# Patient Record
Sex: Female | Born: 1945 | Race: White | Hispanic: No | Smoking: Former smoker
Health system: Southern US, Community
[De-identification: ages and names within clinical notes are randomized; demographics above are authoritative.]

## PROBLEM LIST (undated history)

## (undated) DIAGNOSIS — Z8619 Personal history of other infectious and parasitic diseases: Secondary | ICD-10-CM

## (undated) DIAGNOSIS — Z972 Presence of dental prosthetic device (complete) (partial): Secondary | ICD-10-CM

## (undated) DIAGNOSIS — I10 Essential (primary) hypertension: Secondary | ICD-10-CM

## (undated) DIAGNOSIS — M1990 Unspecified osteoarthritis, unspecified site: Secondary | ICD-10-CM

## (undated) DIAGNOSIS — R112 Nausea with vomiting, unspecified: Secondary | ICD-10-CM

## (undated) DIAGNOSIS — R490 Dysphonia: Secondary | ICD-10-CM

## (undated) DIAGNOSIS — K219 Gastro-esophageal reflux disease without esophagitis: Secondary | ICD-10-CM

## (undated) DIAGNOSIS — Z9889 Other specified postprocedural states: Secondary | ICD-10-CM

## (undated) DIAGNOSIS — M810 Age-related osteoporosis without current pathological fracture: Secondary | ICD-10-CM

## (undated) DIAGNOSIS — Z860100 Personal history of colon polyps, unspecified: Secondary | ICD-10-CM

## (undated) DIAGNOSIS — I209 Angina pectoris, unspecified: Secondary | ICD-10-CM

## (undated) DIAGNOSIS — E039 Hypothyroidism, unspecified: Secondary | ICD-10-CM

## (undated) HISTORY — PX: BREAST SURGERY: SHX581

## (undated) HISTORY — PX: COLONOSCOPY: SHX174

## (undated) HISTORY — PX: EYE SURGERY: SHX253

## (undated) HISTORY — PX: JOINT REPLACEMENT: SHX530

## (undated) HISTORY — PX: ABDOMINAL HYSTERECTOMY: SHX81

## (undated) HISTORY — PX: ESOPHAGOGASTRODUODENOSCOPY: SHX1529

---

## 2003-05-06 HISTORY — PX: BREAST EXCISIONAL BIOPSY: SUR124

## 2004-11-14 ENCOUNTER — Ambulatory Visit: Payer: Self-pay | Admitting: Unknown Physician Specialty

## 2005-12-10 ENCOUNTER — Ambulatory Visit: Payer: Self-pay | Admitting: Unknown Physician Specialty

## 2006-07-03 ENCOUNTER — Ambulatory Visit: Payer: Self-pay | Admitting: Unknown Physician Specialty

## 2006-09-09 ENCOUNTER — Ambulatory Visit: Payer: Self-pay | Admitting: Unknown Physician Specialty

## 2007-03-02 ENCOUNTER — Ambulatory Visit: Payer: Self-pay | Admitting: Unknown Physician Specialty

## 2008-03-02 ENCOUNTER — Ambulatory Visit: Payer: Self-pay | Admitting: Family Medicine

## 2008-04-07 ENCOUNTER — Ambulatory Visit: Payer: Self-pay | Admitting: Unknown Physician Specialty

## 2010-05-02 ENCOUNTER — Ambulatory Visit: Payer: Self-pay | Admitting: Internal Medicine

## 2011-06-30 ENCOUNTER — Ambulatory Visit: Payer: Self-pay | Admitting: Internal Medicine

## 2012-06-30 ENCOUNTER — Ambulatory Visit: Payer: Self-pay | Admitting: Internal Medicine

## 2013-04-05 ENCOUNTER — Ambulatory Visit: Payer: Self-pay | Admitting: Unknown Physician Specialty

## 2013-07-06 ENCOUNTER — Ambulatory Visit: Payer: Self-pay | Admitting: Internal Medicine

## 2014-07-10 ENCOUNTER — Ambulatory Visit: Payer: Self-pay | Admitting: Internal Medicine

## 2015-06-05 DIAGNOSIS — K219 Gastro-esophageal reflux disease without esophagitis: Secondary | ICD-10-CM | POA: Diagnosis not present

## 2015-06-05 DIAGNOSIS — M1990 Unspecified osteoarthritis, unspecified site: Secondary | ICD-10-CM | POA: Diagnosis not present

## 2015-06-05 DIAGNOSIS — I10 Essential (primary) hypertension: Secondary | ICD-10-CM | POA: Diagnosis not present

## 2015-06-05 DIAGNOSIS — E039 Hypothyroidism, unspecified: Secondary | ICD-10-CM | POA: Diagnosis not present

## 2015-06-05 DIAGNOSIS — M25559 Pain in unspecified hip: Secondary | ICD-10-CM | POA: Diagnosis not present

## 2015-06-12 ENCOUNTER — Other Ambulatory Visit: Payer: Self-pay | Admitting: Internal Medicine

## 2015-06-12 DIAGNOSIS — K219 Gastro-esophageal reflux disease without esophagitis: Secondary | ICD-10-CM | POA: Diagnosis not present

## 2015-06-12 DIAGNOSIS — Z1231 Encounter for screening mammogram for malignant neoplasm of breast: Secondary | ICD-10-CM

## 2015-06-12 DIAGNOSIS — L298 Other pruritus: Secondary | ICD-10-CM | POA: Diagnosis not present

## 2015-06-12 DIAGNOSIS — Z1239 Encounter for other screening for malignant neoplasm of breast: Secondary | ICD-10-CM | POA: Diagnosis not present

## 2015-06-12 DIAGNOSIS — Z0000 Encounter for general adult medical examination without abnormal findings: Secondary | ICD-10-CM | POA: Diagnosis not present

## 2015-06-12 DIAGNOSIS — I10 Essential (primary) hypertension: Secondary | ICD-10-CM | POA: Diagnosis not present

## 2015-06-12 DIAGNOSIS — E039 Hypothyroidism, unspecified: Secondary | ICD-10-CM | POA: Diagnosis not present

## 2015-07-11 ENCOUNTER — Ambulatory Visit
Admission: RE | Admit: 2015-07-11 | Discharge: 2015-07-11 | Disposition: A | Discharge status: Home / Self Care | Payer: PPO | Source: Ambulatory Visit | Attending: Internal Medicine | Admitting: Internal Medicine

## 2015-07-11 DIAGNOSIS — Z1231 Encounter for screening mammogram for malignant neoplasm of breast: Secondary | ICD-10-CM | POA: Diagnosis not present

## 2015-07-11 DIAGNOSIS — R921 Mammographic calcification found on diagnostic imaging of breast: Secondary | ICD-10-CM | POA: Diagnosis not present

## 2015-07-16 ENCOUNTER — Other Ambulatory Visit: Payer: Self-pay | Admitting: Internal Medicine

## 2015-07-16 DIAGNOSIS — R920 Mammographic microcalcification found on diagnostic imaging of breast: Secondary | ICD-10-CM

## 2015-07-18 ENCOUNTER — Ambulatory Visit
Admission: RE | Admit: 2015-07-18 | Discharge: 2015-07-18 | Disposition: A | Discharge status: Home / Self Care | Payer: PPO | Source: Ambulatory Visit | Attending: Internal Medicine | Admitting: Internal Medicine

## 2015-07-18 DIAGNOSIS — R921 Mammographic calcification found on diagnostic imaging of breast: Secondary | ICD-10-CM | POA: Diagnosis not present

## 2015-07-18 DIAGNOSIS — R920 Mammographic microcalcification found on diagnostic imaging of breast: Secondary | ICD-10-CM

## 2015-07-18 DIAGNOSIS — R928 Other abnormal and inconclusive findings on diagnostic imaging of breast: Secondary | ICD-10-CM | POA: Diagnosis not present

## 2015-07-31 ENCOUNTER — Ambulatory Visit: Payer: PPO

## 2015-07-31 ENCOUNTER — Other Ambulatory Visit: Payer: PPO

## 2015-10-10 DIAGNOSIS — B001 Herpesviral vesicular dermatitis: Secondary | ICD-10-CM | POA: Diagnosis not present

## 2015-10-10 DIAGNOSIS — B351 Tinea unguium: Secondary | ICD-10-CM | POA: Diagnosis not present

## 2015-12-04 DIAGNOSIS — I10 Essential (primary) hypertension: Secondary | ICD-10-CM | POA: Diagnosis not present

## 2015-12-04 DIAGNOSIS — E039 Hypothyroidism, unspecified: Secondary | ICD-10-CM | POA: Diagnosis not present

## 2015-12-04 DIAGNOSIS — K219 Gastro-esophageal reflux disease without esophagitis: Secondary | ICD-10-CM | POA: Diagnosis not present

## 2015-12-04 DIAGNOSIS — L298 Other pruritus: Secondary | ICD-10-CM | POA: Diagnosis not present

## 2015-12-11 DIAGNOSIS — R002 Palpitations: Secondary | ICD-10-CM | POA: Diagnosis not present

## 2015-12-18 DIAGNOSIS — R002 Palpitations: Secondary | ICD-10-CM | POA: Diagnosis not present

## 2016-01-01 DIAGNOSIS — R790 Abnormal level of blood mineral: Secondary | ICD-10-CM | POA: Diagnosis not present

## 2016-04-18 DIAGNOSIS — Z86010 Personal history of colonic polyps: Secondary | ICD-10-CM | POA: Diagnosis not present

## 2016-04-18 DIAGNOSIS — R490 Dysphonia: Secondary | ICD-10-CM | POA: Diagnosis not present

## 2016-04-18 DIAGNOSIS — K219 Gastro-esophageal reflux disease without esophagitis: Secondary | ICD-10-CM | POA: Diagnosis not present

## 2016-06-06 DIAGNOSIS — J40 Bronchitis, not specified as acute or chronic: Secondary | ICD-10-CM | POA: Diagnosis not present

## 2016-06-10 DIAGNOSIS — I10 Essential (primary) hypertension: Secondary | ICD-10-CM | POA: Diagnosis not present

## 2016-06-10 DIAGNOSIS — K219 Gastro-esophageal reflux disease without esophagitis: Secondary | ICD-10-CM | POA: Diagnosis not present

## 2016-06-10 DIAGNOSIS — R002 Palpitations: Secondary | ICD-10-CM | POA: Diagnosis not present

## 2016-06-10 DIAGNOSIS — E039 Hypothyroidism, unspecified: Secondary | ICD-10-CM | POA: Diagnosis not present

## 2016-06-11 ENCOUNTER — Ambulatory Visit: Admit: 2016-06-11 | Payer: PPO | Admitting: Unknown Physician Specialty

## 2016-06-11 SURGERY — EGD (ESOPHAGOGASTRODUODENOSCOPY)
Anesthesia: General

## 2016-06-17 DIAGNOSIS — K219 Gastro-esophageal reflux disease without esophagitis: Secondary | ICD-10-CM | POA: Diagnosis not present

## 2016-06-17 DIAGNOSIS — Z1231 Encounter for screening mammogram for malignant neoplasm of breast: Secondary | ICD-10-CM | POA: Diagnosis not present

## 2016-06-17 DIAGNOSIS — E039 Hypothyroidism, unspecified: Secondary | ICD-10-CM | POA: Diagnosis not present

## 2016-06-17 DIAGNOSIS — Z0000 Encounter for general adult medical examination without abnormal findings: Secondary | ICD-10-CM | POA: Diagnosis not present

## 2016-06-17 DIAGNOSIS — R739 Hyperglycemia, unspecified: Secondary | ICD-10-CM | POA: Diagnosis not present

## 2016-06-17 DIAGNOSIS — I10 Essential (primary) hypertension: Secondary | ICD-10-CM | POA: Diagnosis not present

## 2016-06-19 ENCOUNTER — Other Ambulatory Visit: Payer: Self-pay | Admitting: Internal Medicine

## 2016-06-19 DIAGNOSIS — Z1231 Encounter for screening mammogram for malignant neoplasm of breast: Secondary | ICD-10-CM

## 2016-07-07 ENCOUNTER — Ambulatory Visit: Admit: 2016-07-07 | Payer: PPO | Admitting: Unknown Physician Specialty

## 2016-07-07 SURGERY — COLONOSCOPY WITH PROPOFOL
Anesthesia: General

## 2016-07-08 ENCOUNTER — Encounter: Payer: Self-pay | Admitting: *Deleted

## 2016-07-09 ENCOUNTER — Encounter: Payer: Self-pay | Admitting: *Deleted

## 2016-07-09 ENCOUNTER — Ambulatory Visit
Admission: RE | Admit: 2016-07-09 | Discharge: 2016-07-09 | Disposition: A | Discharge status: Home / Self Care | Payer: PPO | Source: Ambulatory Visit | Attending: Unknown Physician Specialty | Admitting: Unknown Physician Specialty

## 2016-07-09 ENCOUNTER — Encounter
Admission: RE | Disposition: A | Discharge status: Home / Self Care | Payer: Self-pay | Source: Ambulatory Visit | Attending: Unknown Physician Specialty

## 2016-07-09 ENCOUNTER — Ambulatory Visit: Payer: PPO | Admitting: Anesthesiology

## 2016-07-09 DIAGNOSIS — Z8719 Personal history of other diseases of the digestive system: Secondary | ICD-10-CM | POA: Diagnosis not present

## 2016-07-09 DIAGNOSIS — K219 Gastro-esophageal reflux disease without esophagitis: Secondary | ICD-10-CM | POA: Diagnosis not present

## 2016-07-09 DIAGNOSIS — D120 Benign neoplasm of cecum: Secondary | ICD-10-CM | POA: Diagnosis not present

## 2016-07-09 DIAGNOSIS — Z87891 Personal history of nicotine dependence: Secondary | ICD-10-CM | POA: Diagnosis not present

## 2016-07-09 DIAGNOSIS — K5730 Diverticulosis of large intestine without perforation or abscess without bleeding: Secondary | ICD-10-CM | POA: Diagnosis not present

## 2016-07-09 DIAGNOSIS — Z79899 Other long term (current) drug therapy: Secondary | ICD-10-CM | POA: Diagnosis not present

## 2016-07-09 DIAGNOSIS — K5790 Diverticulosis of intestine, part unspecified, without perforation or abscess without bleeding: Secondary | ICD-10-CM | POA: Diagnosis not present

## 2016-07-09 DIAGNOSIS — K648 Other hemorrhoids: Secondary | ICD-10-CM | POA: Diagnosis not present

## 2016-07-09 DIAGNOSIS — Z1211 Encounter for screening for malignant neoplasm of colon: Secondary | ICD-10-CM | POA: Diagnosis not present

## 2016-07-09 DIAGNOSIS — I10 Essential (primary) hypertension: Secondary | ICD-10-CM | POA: Diagnosis not present

## 2016-07-09 DIAGNOSIS — K640 First degree hemorrhoids: Secondary | ICD-10-CM | POA: Insufficient documentation

## 2016-07-09 DIAGNOSIS — E039 Hypothyroidism, unspecified: Secondary | ICD-10-CM | POA: Insufficient documentation

## 2016-07-09 DIAGNOSIS — K2950 Unspecified chronic gastritis without bleeding: Secondary | ICD-10-CM | POA: Insufficient documentation

## 2016-07-09 DIAGNOSIS — Z86010 Personal history of colonic polyps: Secondary | ICD-10-CM | POA: Diagnosis not present

## 2016-07-09 DIAGNOSIS — K635 Polyp of colon: Secondary | ICD-10-CM | POA: Diagnosis not present

## 2016-07-09 DIAGNOSIS — K317 Polyp of stomach and duodenum: Secondary | ICD-10-CM | POA: Diagnosis not present

## 2016-07-09 DIAGNOSIS — K2960 Other gastritis without bleeding: Secondary | ICD-10-CM | POA: Diagnosis not present

## 2016-07-09 DIAGNOSIS — K3189 Other diseases of stomach and duodenum: Secondary | ICD-10-CM | POA: Diagnosis not present

## 2016-07-09 HISTORY — PX: ESOPHAGOGASTRODUODENOSCOPY: SHX5428

## 2016-07-09 HISTORY — PX: COLONOSCOPY WITH PROPOFOL: SHX5780

## 2016-07-09 HISTORY — DX: Hypothyroidism, unspecified: E03.9

## 2016-07-09 HISTORY — DX: Personal history of colon polyps, unspecified: Z86.0100

## 2016-07-09 HISTORY — DX: Gastro-esophageal reflux disease without esophagitis: K21.9

## 2016-07-09 HISTORY — DX: Age-related osteoporosis without current pathological fracture: M81.0

## 2016-07-09 HISTORY — DX: Essential (primary) hypertension: I10

## 2016-07-09 HISTORY — DX: Personal history of other infectious and parasitic diseases: Z86.19

## 2016-07-09 HISTORY — DX: Personal history of colonic polyps: Z86.010

## 2016-07-09 HISTORY — DX: Dysphonia: R49.0

## 2016-07-09 SURGERY — EGD (ESOPHAGOGASTRODUODENOSCOPY)
Anesthesia: General

## 2016-07-09 MED ORDER — KETAMINE HCL 10 MG/ML IJ SOLN
INTRAMUSCULAR | Status: AC
  Filled 2016-07-09: qty 1

## 2016-07-09 MED ORDER — SODIUM CHLORIDE 0.9 % IV SOLN
INTRAVENOUS | Status: DC
  Administered 2016-07-09: 09:00:00 via INTRAVENOUS
  Administered 2016-07-09: 1000 mL via INTRAVENOUS

## 2016-07-09 MED ORDER — SODIUM CHLORIDE 0.9 % IV SOLN: INTRAVENOUS | Status: DC

## 2016-07-09 MED ORDER — KETAMINE HCL 50 MG/ML IJ SOLN
INTRAMUSCULAR | Status: DC | PRN
  Administered 2016-07-09: 5 mg via INTRAMUSCULAR

## 2016-07-09 MED ORDER — PROPOFOL 10 MG/ML IV BOLUS
INTRAVENOUS | Status: AC
  Filled 2016-07-09: qty 20

## 2016-07-09 MED ORDER — EPHEDRINE SULFATE 50 MG/ML IJ SOLN
INTRAMUSCULAR | Status: DC | PRN
  Administered 2016-07-09: 10 mg via INTRAVENOUS

## 2016-07-09 MED ORDER — PROPOFOL 500 MG/50ML IV EMUL
INTRAVENOUS | Status: DC | PRN
  Administered 2016-07-09: 50 ug/kg/min via INTRAVENOUS

## 2016-07-09 MED ORDER — PHENYLEPHRINE HCL 10 MG/ML IJ SOLN
INTRAMUSCULAR | Status: DC | PRN
  Administered 2016-07-09 (×2): 100 ug via INTRAVENOUS

## 2016-07-09 NOTE — Op Note (Signed)
[facility] Gastroenterology Patient Name: Brandi Schwartz Procedure Date: 07/09/2016 9:17 AM MRN: [id number] Account #: [id number] Date of Birth: 1946-04-21 Admit Type: Outpatient Age: 71 Room: [facility] ENDO ROOM 4 Gender: Female Note Status: Finalized Procedure:            Upper GI endoscopy Indications:          Heartburn Providers:            Manya Silvas, MD Referring MD:         Tracie Harrier, MD (Referring MD) Medicines:            Propofol per Anesthesia Complications:        No immediate complications. Procedure:            Pre-Anesthesia Assessment:                       - After reviewing the risks and benefits, the patient                        was deemed in satisfactory condition to undergo the                        procedure.                       After obtaining informed consent, the endoscope was                        passed under direct vision. Throughout the procedure,                        the patient's blood pressure, pulse, and oxygen                        saturations were monitored continuously. The Endoscope                        was introduced through the mouth, and advanced to the                        second part of duodenum. The upper GI endoscopy was                        accomplished without difficulty. The patient tolerated                        the procedure well. Findings:      The examined esophagus was normal.      Diffuse and patchy mild inflammation characterized by erythema and       granularity was found in the gastric antrum. Biopsies were taken with a       cold forceps for histology. Biopsies were taken with a cold forceps for       Helicobacter pylori testing.      Multiple medium pedunculated and sessile fundic gland polyps with no       bleeding and no stigmata of recent bleeding were found in the gastric       body.      The examined duodenum was normal. Impression:           - Normal esophagus.             -  Gastritis. Biopsied.                       - Multiple fundic gland polyps.                       - Normal examined duodenum. Recommendation:       - Await pathology results.                       Take medicine.                       - Perform a colonoscopy as previously scheduled. Manya Silvas, MD 07/09/2016 9:37:23 AM This report has been signed electronically. Number of Addenda: 0 Note Initiated On: 07/09/2016 9:17 AM      [facility]

## 2016-07-09 NOTE — Anesthesia Preprocedure Evaluation (Signed)
Anesthesia Evaluation  Patient identified by MRN, date of birth, ID band Patient awake    Reviewed: Allergy & Precautions, NPO status , Patient's Chart, lab work & pertinent test results  History of Anesthesia Complications Negative for: history of anesthetic complications  Airway Mallampati: II  TM Distance: >3 FB Neck ROM: Full    Dental  (+) Upper Dentures   Pulmonary neg sleep apnea, neg COPD, former smoker,    breath sounds clear to auscultation- rhonchi (-) wheezing      Cardiovascular Exercise Tolerance: Good hypertension, Pt. on medications (-) CAD and (-) Past MI  Rhythm:Regular Rate:Normal - Systolic murmurs and - Diastolic murmurs    Neuro/Psych negative neurological ROS  negative psych ROS   GI/Hepatic Neg liver ROS, GERD  ,  Endo/Other  neg diabetesHypothyroidism   Renal/GU negative Renal ROS     Musculoskeletal negative musculoskeletal ROS (+)   Abdominal (+) + obese,   Peds  Hematology negative hematology ROS (+)   Anesthesia Other Findings Past Medical History: No date: GERD (gastroesophageal reflux disease) No date: History of colonic polyps No date: History of shingles No date: Hoarseness, chronic No date: Hypertension No date: Hypothyroidism No date: Osteoporosis   Reproductive/Obstetrics                             Anesthesia Physical Anesthesia Plan  ASA: II  Anesthesia Plan: General   Post-op Pain Management:    Induction: Intravenous  Airway Management Planned: Natural Airway  Additional Equipment:   Intra-op Plan:   Post-operative Plan:   Informed Consent: I have reviewed the patients History and Physical, chart, labs and discussed the procedure including the risks, benefits and alternatives for the proposed anesthesia with the patient or authorized representative who has indicated his/her understanding and acceptance.   Dental advisory  given  Plan Discussed with: CRNA and Anesthesiologist  Anesthesia Plan Comments:         Anesthesia Quick Evaluation

## 2016-07-09 NOTE — Transfer of Care (Signed)
Immediate Anesthesia Transfer of Care Note  Patient: Brandi Schwartz  Procedure(s) Performed: Procedure(s): ESOPHAGOGASTRODUODENOSCOPY (EGD) (N/A) COLONOSCOPY WITH PROPOFOL (N/A)  Patient Location: PACU  Anesthesia Type:General  Level of Consciousness: awake, alert  and oriented  Airway & Oxygen Therapy: Patient Spontanous Breathing and Patient connected to nasal cannula oxygen  Post-op Assessment: Report given to RN and Post -op Vital signs reviewed and stable  Post vital signs: Reviewed and stable  Last Vitals:  Vitals:   07/09/16 0847 07/09/16 1006  BP: 134/71 (!) 98/37  Pulse: 98 91  Resp: 20 (!) 27  Temp: 36.7 C (!) 35.7 C    Last Pain:  Vitals:   07/09/16 1006  TempSrc: Tympanic         Complications: No apparent anesthesia complications

## 2016-07-09 NOTE — Op Note (Signed)
[facility] Gastroenterology Patient Name: Brandi Schwartz Procedure Date: 07/09/2016 9:17 AM MRN: [id number] Account #: [id number] Date of Birth: 05-12-1945 Admit Type: Outpatient Age: 71 Room: [facility] ENDO ROOM 4 Gender: Female Note Status: Finalized Procedure:            Colonoscopy Indications:          High risk colon cancer surveillance: Personal history                        of colonic polyps Providers:            Manya Silvas, MD Medicines:            Propofol per Anesthesia Complications:        No immediate complications. Procedure:            Pre-Anesthesia Assessment:                       - After reviewing the risks and benefits, the patient                        was deemed in satisfactory condition to undergo the                        procedure.                       After obtaining informed consent, the colonoscope was                        passed under direct vision. Throughout the procedure,                        the patient's blood pressure, pulse, and oxygen                        saturations were monitored continuously. The                        Colonoscope was introduced through the anus and                        advanced to the the cecum, identified by appendiceal                        orifice and ileocecal valve. The colonoscopy was                        somewhat difficult due to restricted mobility of the                        colon. Successful completion of the procedure was aided                        by applying abdominal pressure. The patient tolerated                        the procedure well. The quality of the bowel                        preparation was excellent. Findings:      A  6 mm polyp was found in the appendiceal orifice. The polyp was       sessile. The polyp was removed with a hot snare. Resection and retrieval       were complete. This did not appear to extend into the appendix itself.      A few small-mouthed  diverticula were found in the sigmoid colon.      Internal hemorrhoids were found during endoscopy. The hemorrhoids were       small and Grade I (internal hemorrhoids that do not prolapse).      The exam was otherwise without abnormality. Impression:           - One 6 mm polyp at the appendiceal orifice, removed                        with a hot snare. Resected and retrieved.                       - Diverticulosis in the sigmoid colon.                       - Internal hemorrhoids.                       - The examination was otherwise normal. Recommendation:       - Await pathology results. Manya Silvas, MD 07/09/2016 10:06:11 AM This report has been signed electronically. Number of Addenda: 0 Note Initiated On: 07/09/2016 9:17 AM Scope Withdrawal Time: 0 hours 0 minutes 36 seconds  Total Procedure Duration: 0 hours 12 minutes 31 seconds       [facility]

## 2016-07-09 NOTE — Anesthesia Post-op Follow-up Note (Cosign Needed)
Anesthesia QCDR form completed.        

## 2016-07-09 NOTE — Anesthesia Postprocedure Evaluation (Signed)
Anesthesia Post Note  Patient: Brandi Schwartz  Procedure(s) Performed: Procedure(s) (LRB): ESOPHAGOGASTRODUODENOSCOPY (EGD) (N/A) COLONOSCOPY WITH PROPOFOL (N/A)  Patient location during evaluation: Endoscopy Anesthesia Type: General Level of consciousness: awake and alert and oriented Pain management: pain level controlled Vital Signs Assessment: post-procedure vital signs reviewed and stable Respiratory status: spontaneous breathing, nonlabored ventilation and respiratory function stable Cardiovascular status: blood pressure returned to baseline and stable Postop Assessment: no signs of nausea or vomiting Anesthetic complications: no     Last Vitals:  Vitals:   07/09/16 1026 07/09/16 1036  BP: 121/63 (!) 117/58  Pulse: 85 85  Resp: (!) 27 20  Temp:      Last Pain:  Vitals:   07/09/16 1006  TempSrc: Tympanic                 Alania Overholt

## 2016-07-09 NOTE — H&P (Signed)
   Primary Care Physician:  Tracie Harrier, MD Primary Gastroenterologist:  Dr. Vira Agar  Pre-Procedure History & Physical: HPI:  Brandi Schwartz is a 71 y.o. female is here for an endoscopy and colonoscopy.   Past Medical History:  Diagnosis Date  . GERD (gastroesophageal reflux disease)   . History of colonic polyps   . History of shingles   . Hoarseness, chronic   . Hypertension   . Hypothyroidism   . Osteoporosis     Past Surgical History:  Procedure Laterality Date  . ABDOMINAL HYSTERECTOMY    . BREAST BIOPSY Left 2005   EXCISIONAL - NEG  . COLONOSCOPY    . ESOPHAGOGASTRODUODENOSCOPY      Prior to Admission medications   Medication Sig Start Date End Date Taking? Authorizing Provider  ALPRAZolam Duanne Moron) 0.25 MG tablet Take 0.25 mg by mouth at bedtime as needed for anxiety.   Yes Historical Provider, MD  amLODipine (NORVASC) 5 MG tablet Take 5 mg by mouth daily.   Yes Historical Provider, MD  hydrochlorothiazide (HYDRODIURIL) 12.5 MG tablet Take 12.5 mg by mouth daily.   Yes Historical Provider, MD  levothyroxine (SYNTHROID, LEVOTHROID) 112 MCG tablet Take 112 mcg by mouth daily before breakfast.   Yes Historical Provider, MD  losartan (COZAAR) 100 MG tablet Take 100 mg by mouth daily.   Yes Historical Provider, MD  omeprazole (PRILOSEC) 20 MG capsule Take 20 mg by mouth daily.   Yes Historical Provider, MD  Vitamin D, Ergocalciferol, (DRISDOL) 50000 units CAPS capsule Take 50,000 Units by mouth every 7 (seven) days.   Yes Historical Provider, MD    Allergies as of 06/09/2016  . (Not on File)    Family History  Problem Relation Age of Onset  . Breast cancer Sister 27    Social History   Social History  . Marital status: Divorced    Spouse name: N/A  . Number of children: N/A  . Years of education: N/A   Occupational History  . Not on file.   Social History Main Topics  . Smoking status: Former Smoker    Types: Cigarettes    Quit date: 10/08/2003  .  Smokeless tobacco: Never Used  . Alcohol use No     Comment: rare  . Drug use: No  . Sexual activity: Not on file   Other Topics Concern  . Not on file   Social History Narrative  . No narrative on file    Review of Systems: See HPI, otherwise negative ROS  Physical Exam: BP 134/71   Pulse 98   Temp 98 F (36.7 C) (Tympanic)   Resp 20   Ht 5\' 4"  (1.626 m)   Wt 90.7 kg (200 lb)   SpO2 98%   BMI 34.33 kg/m  General:   Alert,  pleasant and cooperative in NAD Head:  Normocephalic and atraumatic. Neck:  Supple; no masses or thyromegaly. Lungs:  Clear throughout to auscultation.    Heart:  Regular rate and rhythm. Abdomen:  Soft, nontender and nondistended. Normal bowel sounds, without guarding, and without rebound.   Neurologic:  Alert and  oriented x4;  grossly normal neurologically.  Impression/Plan: Brandi Schwartz is here for an endoscopy and colonoscopy to be performed for GERD, heart burn, PH colon polyps  Risks, benefits, limitations, and alternatives regarding  endoscopy and colonoscopy have been reviewed with the patient.  Questions have been answered.  All parties agreeable.   Gaylyn Cheers, MD  07/09/2016, 9:19 AM

## 2016-07-10 ENCOUNTER — Encounter: Payer: Self-pay | Admitting: Unknown Physician Specialty

## 2016-07-10 LAB — SURGICAL PATHOLOGY

## 2016-08-01 ENCOUNTER — Ambulatory Visit
Admission: RE | Admit: 2016-08-01 | Discharge: 2016-08-01 | Disposition: A | Discharge status: Home / Self Care | Payer: PPO | Source: Ambulatory Visit | Attending: Internal Medicine | Admitting: Internal Medicine

## 2016-08-01 DIAGNOSIS — Z1231 Encounter for screening mammogram for malignant neoplasm of breast: Secondary | ICD-10-CM | POA: Diagnosis not present

## 2016-12-23 DIAGNOSIS — R202 Paresthesia of skin: Secondary | ICD-10-CM | POA: Diagnosis not present

## 2016-12-23 DIAGNOSIS — D485 Neoplasm of uncertain behavior of skin: Secondary | ICD-10-CM | POA: Diagnosis not present

## 2016-12-23 DIAGNOSIS — L570 Actinic keratosis: Secondary | ICD-10-CM | POA: Diagnosis not present

## 2016-12-23 DIAGNOSIS — R739 Hyperglycemia, unspecified: Secondary | ICD-10-CM | POA: Diagnosis not present

## 2016-12-23 DIAGNOSIS — X32XXXA Exposure to sunlight, initial encounter: Secondary | ICD-10-CM | POA: Diagnosis not present

## 2016-12-23 DIAGNOSIS — Z0000 Encounter for general adult medical examination without abnormal findings: Secondary | ICD-10-CM | POA: Diagnosis not present

## 2016-12-23 DIAGNOSIS — Z1231 Encounter for screening mammogram for malignant neoplasm of breast: Secondary | ICD-10-CM | POA: Diagnosis not present

## 2016-12-23 DIAGNOSIS — E039 Hypothyroidism, unspecified: Secondary | ICD-10-CM | POA: Diagnosis not present

## 2016-12-23 DIAGNOSIS — R8290 Unspecified abnormal findings in urine: Secondary | ICD-10-CM | POA: Diagnosis not present

## 2016-12-23 DIAGNOSIS — K219 Gastro-esophageal reflux disease without esophagitis: Secondary | ICD-10-CM | POA: Diagnosis not present

## 2016-12-23 DIAGNOSIS — I10 Essential (primary) hypertension: Secondary | ICD-10-CM | POA: Diagnosis not present

## 2016-12-30 DIAGNOSIS — K219 Gastro-esophageal reflux disease without esophagitis: Secondary | ICD-10-CM | POA: Diagnosis not present

## 2016-12-30 DIAGNOSIS — R490 Dysphonia: Secondary | ICD-10-CM | POA: Diagnosis not present

## 2016-12-30 DIAGNOSIS — R252 Cramp and spasm: Secondary | ICD-10-CM | POA: Diagnosis not present

## 2016-12-30 DIAGNOSIS — L570 Actinic keratosis: Secondary | ICD-10-CM | POA: Diagnosis not present

## 2016-12-30 DIAGNOSIS — X32XXXA Exposure to sunlight, initial encounter: Secondary | ICD-10-CM | POA: Diagnosis not present

## 2016-12-30 DIAGNOSIS — E039 Hypothyroidism, unspecified: Secondary | ICD-10-CM | POA: Diagnosis not present

## 2016-12-30 DIAGNOSIS — I10 Essential (primary) hypertension: Secondary | ICD-10-CM | POA: Diagnosis not present

## 2017-01-23 DIAGNOSIS — Z23 Encounter for immunization: Secondary | ICD-10-CM | POA: Diagnosis not present

## 2017-03-31 DIAGNOSIS — H2513 Age-related nuclear cataract, bilateral: Secondary | ICD-10-CM | POA: Diagnosis not present

## 2017-05-15 DIAGNOSIS — H2512 Age-related nuclear cataract, left eye: Secondary | ICD-10-CM | POA: Diagnosis not present

## 2017-05-19 ENCOUNTER — Other Ambulatory Visit: Payer: Self-pay

## 2017-05-19 ENCOUNTER — Encounter: Payer: Self-pay | Admitting: *Deleted

## 2017-05-26 NOTE — Discharge Instructions (Signed)

## 2017-05-27 ENCOUNTER — Encounter
Admission: RE | Disposition: A | Discharge status: Home / Self Care | Payer: Self-pay | Source: Ambulatory Visit | Attending: Ophthalmology

## 2017-05-27 ENCOUNTER — Ambulatory Visit: Payer: PPO | Admitting: Anesthesiology

## 2017-05-27 ENCOUNTER — Ambulatory Visit
Admission: RE | Admit: 2017-05-27 | Discharge: 2017-05-27 | Disposition: A | Discharge status: Home / Self Care | Payer: PPO | Source: Ambulatory Visit | Attending: Ophthalmology | Admitting: Ophthalmology

## 2017-05-27 ENCOUNTER — Encounter: Payer: Self-pay | Admitting: *Deleted

## 2017-05-27 DIAGNOSIS — Z79899 Other long term (current) drug therapy: Secondary | ICD-10-CM | POA: Insufficient documentation

## 2017-05-27 DIAGNOSIS — K219 Gastro-esophageal reflux disease without esophagitis: Secondary | ICD-10-CM | POA: Diagnosis not present

## 2017-05-27 DIAGNOSIS — H2512 Age-related nuclear cataract, left eye: Secondary | ICD-10-CM | POA: Diagnosis not present

## 2017-05-27 DIAGNOSIS — I10 Essential (primary) hypertension: Secondary | ICD-10-CM | POA: Diagnosis not present

## 2017-05-27 DIAGNOSIS — Z87891 Personal history of nicotine dependence: Secondary | ICD-10-CM | POA: Diagnosis not present

## 2017-05-27 HISTORY — DX: Other specified postprocedural states: R11.2

## 2017-05-27 HISTORY — PX: CATARACT EXTRACTION W/PHACO: SHX586

## 2017-05-27 HISTORY — DX: Other specified postprocedural states: Z98.890

## 2017-05-27 HISTORY — DX: Presence of dental prosthetic device (complete) (partial): Z97.2

## 2017-05-27 SURGERY — PHACOEMULSIFICATION, CATARACT, WITH IOL INSERTION
Anesthesia: General | Laterality: Left | Wound class: Clean

## 2017-05-27 MED ORDER — MOXIFLOXACIN HCL 0.5 % OP SOLN
1.0000 [drp] | OPHTHALMIC | Status: DC | PRN
  Administered 2017-05-27 (×3): 1 [drp] via OPHTHALMIC

## 2017-05-27 MED ORDER — MIDAZOLAM HCL 2 MG/2ML IJ SOLN
INTRAMUSCULAR | Status: DC | PRN
  Administered 2017-05-27: 1 mg via INTRAVENOUS

## 2017-05-27 MED ORDER — ARMC OPHTHALMIC DILATING DROPS
1.0000 "application " | OPHTHALMIC | Status: DC | PRN
  Administered 2017-05-27 (×3): 1 via OPHTHALMIC

## 2017-05-27 MED ORDER — CEFUROXIME OPHTHALMIC INJECTION 1 MG/0.1 ML
INJECTION | OPHTHALMIC | Status: DC | PRN
  Administered 2017-05-27: 0.1 mL via INTRACAMERAL

## 2017-05-27 MED ORDER — EPINEPHRINE PF 1 MG/ML IJ SOLN
INTRAOCULAR | Status: DC | PRN
  Administered 2017-05-27: 53 mL via OPHTHALMIC

## 2017-05-27 MED ORDER — FENTANYL CITRATE (PF) 100 MCG/2ML IJ SOLN
INTRAMUSCULAR | Status: DC | PRN
  Administered 2017-05-27: 50 ug via INTRAVENOUS

## 2017-05-27 MED ORDER — NA HYALUR & NA CHOND-NA HYALUR 0.4-0.35 ML IO KIT
PACK | INTRAOCULAR | Status: DC | PRN
  Administered 2017-05-27: 1 mL via INTRAOCULAR

## 2017-05-27 MED ORDER — BRIMONIDINE TARTRATE-TIMOLOL 0.2-0.5 % OP SOLN
OPHTHALMIC | Status: DC | PRN
  Administered 2017-05-27: 1 [drp] via OPHTHALMIC

## 2017-05-27 MED ORDER — LIDOCAINE HCL (PF) 2 % IJ SOLN
INTRAOCULAR | Status: DC | PRN
  Administered 2017-05-27: 1 mL via INTRAMUSCULAR

## 2017-05-27 SURGICAL SUPPLY — 25 items
CANNULA ANT/CHMB 27GA (MISCELLANEOUS) ×3 IMPLANT
CARTRIDGE ABBOTT (MISCELLANEOUS) IMPLANT
GLOVE SURG LX 7.5 STRW (GLOVE) ×2
GLOVE SURG LX STRL 7.5 STRW (GLOVE) ×1 IMPLANT
GLOVE SURG TRIUMPH 8.0 PF LTX (GLOVE) ×3 IMPLANT
GOWN STRL REUS W/ TWL LRG LVL3 (GOWN DISPOSABLE) ×2 IMPLANT
GOWN STRL REUS W/TWL LRG LVL3 (GOWN DISPOSABLE) ×4
LENS IOL TECNIS ITEC 22.5 (Intraocular Lens) ×3 IMPLANT
MARKER SKIN DUAL TIP RULER LAB (MISCELLANEOUS) ×3 IMPLANT
NDL RETROBULBAR .5 NSTRL (NEEDLE) IMPLANT
NEEDLE FILTER BLUNT 18X 1/2SAF (NEEDLE) ×2
NEEDLE FILTER BLUNT 18X1 1/2 (NEEDLE) ×1 IMPLANT
PACK CATARACT BRASINGTON (MISCELLANEOUS) ×3 IMPLANT
PACK EYE AFTER SURG (MISCELLANEOUS) ×3 IMPLANT
PACK OPTHALMIC (MISCELLANEOUS) ×3 IMPLANT
RING MALYGIN 7.0 (MISCELLANEOUS) IMPLANT
SUT ETHILON 10-0 CS-B-6CS-B-6 (SUTURE)
SUT VICRYL  9 0 (SUTURE)
SUT VICRYL 9 0 (SUTURE) IMPLANT
SUTURE EHLN 10-0 CS-B-6CS-B-6 (SUTURE) IMPLANT
SYR 3ML LL SCALE MARK (SYRINGE) ×3 IMPLANT
SYR 5ML LL (SYRINGE) ×3 IMPLANT
SYR TB 1ML LUER SLIP (SYRINGE) ×3 IMPLANT
WATER STERILE IRR 250ML POUR (IV SOLUTION) ×3 IMPLANT
WIPE NON LINTING 3.25X3.25 (MISCELLANEOUS) ×3 IMPLANT

## 2017-05-27 NOTE — H&P (Signed)
The History and Physical notes are on paper, have been signed, and are to be scanned. The patient remains stable and unchanged from the H&P.   Previous H&P reviewed, patient examined, and there are no changes.  Brandi Schwartz 05/27/2017 9:19 AM

## 2017-05-27 NOTE — Anesthesia Procedure Notes (Signed)
Procedure Name: MAC Performed by: Lind Guest, CRNA Pre-anesthesia Checklist: Patient identified, Emergency Drugs available, Suction available, Patient being monitored and Timeout performed Patient Re-evaluated:Patient Re-evaluated prior to induction Oxygen Delivery Method: Nasal cannula

## 2017-05-27 NOTE — Transfer of Care (Signed)
Immediate Anesthesia Transfer of Care Note  Patient: Brandi Schwartz  Procedure(s) Performed: CATARACT EXTRACTION PHACO AND INTRAOCULAR LENS PLACEMENT (IOC) LEFT (Left )  Patient Location: PACU  Anesthesia Type: General  Level of Consciousness: awake, alert  and patient cooperative  Airway and Oxygen Therapy: Patient Spontanous Breathing and Patient connected to supplemental oxygen  Post-op Assessment: Post-op Vital signs reviewed, Patient's Cardiovascular Status Stable, Respiratory Function Stable, Patent Airway and No signs of Nausea or vomiting  Post-op Vital Signs: Reviewed and stable  Complications: No apparent anesthesia complications

## 2017-05-27 NOTE — Op Note (Signed)
OPERATIVE NOTE  Brandi Schwartz [id number] 05/27/2017   PREOPERATIVE DIAGNOSIS:  Nuclear sclerotic cataract left eye. H25.12   POSTOPERATIVE DIAGNOSIS:    Nuclear sclerotic cataract left eye.     PROCEDURE:  Phacoemusification with posterior chamber intraocular lens placement of the left eye   LENS:   Implant Name Type Inv. Item Serial No. Manufacturer Lot No. LRB No. Used  LENS IOL DIOP 22.5 - [id number] Intraocular Lens LENS IOL DIOP 22.5 [id number]  Left 1        ULTRASOUND TIME: 14  % of 1 minutes 2 seconds, CDE 8.5  SURGEON:  Wyonia Hough, MD   ANESTHESIA:  Topical with tetracaine drops and 2% Xylocaine jelly, augmented with 1% preservative-free intracameral lidocaine.    COMPLICATIONS:  None.   DESCRIPTION OF PROCEDURE:  The patient was identified in the holding room and transported to the operating room and placed in the supine position under the operating microscope.  The left eye was identified as the operative eye and it was prepped and draped in the usual sterile ophthalmic fashion.   A 1 millimeter clear-corneal paracentesis was made at the 1:30 position.  0.5 ml of preservative-free 1% lidocaine was injected into the anterior chamber.  The anterior chamber was filled with Viscoat viscoelastic.  A 2.4 millimeter keratome was used to make a near-clear corneal incision at the 10:30 position.  .  A curvilinear capsulorrhexis was made with a cystotome and capsulorrhexis forceps.  Balanced salt solution was used to hydrodissect and hydrodelineate the nucleus.   Phacoemulsification was then used in stop and chop fashion to remove the lens nucleus and epinucleus.  The remaining cortex was then removed using the irrigation and aspiration handpiece. Provisc was then placed into the capsular bag to distend it for lens placement.  A lens was then injected into the capsular bag.  The remaining viscoelastic was aspirated.   Wounds were hydrated with balanced salt  solution.  The anterior chamber was inflated to a physiologic pressure with balanced salt solution.  No wound leaks were noted. Cefuroxime 0.1 ml of a 10mg /ml solution was injected into the anterior chamber for a dose of 1 mg of intracameral antibiotic at the completion of the case.   Timolol and Brimonidine drops were applied to the eye.  The patient was taken to the recovery room in stable condition without complications of anesthesia or surgery.  Brandi Schwartz 05/27/2017, 10:02 AM

## 2017-05-27 NOTE — Anesthesia Postprocedure Evaluation (Signed)
Anesthesia Post Note  Patient: Brandi Schwartz  Procedure(s) Performed: CATARACT EXTRACTION PHACO AND INTRAOCULAR LENS PLACEMENT (IOC) LEFT (Left )  Patient location during evaluation: PACU Anesthesia Type: General Level of consciousness: awake and alert Pain management: pain level controlled Vital Signs Assessment: post-procedure vital signs reviewed and stable Respiratory status: spontaneous breathing, nonlabored ventilation and respiratory function stable Cardiovascular status: blood pressure returned to baseline and stable Postop Assessment: no apparent nausea or vomiting Anesthetic complications: no    DANIEL D KOVACS

## 2017-05-27 NOTE — Anesthesia Preprocedure Evaluation (Signed)
Anesthesia Evaluation  Patient identified by MRN, date of birth, ID band Patient awake    Reviewed: Allergy & Precautions, H&P , NPO status , Patient's Chart, lab work & pertinent test results, reviewed documented beta blocker date and time   History of Anesthesia Complications (+) PONV and history of anesthetic complications  Airway Mallampati: II  TM Distance: >3 FB Neck ROM: full    Dental no notable dental hx.    Pulmonary neg pulmonary ROS, former smoker,    Pulmonary exam normal breath sounds clear to auscultation       Cardiovascular Exercise Tolerance: Good hypertension, negative cardio ROS   Rhythm:regular Rate:Normal     Neuro/Psych negative neurological ROS  negative psych ROS   GI/Hepatic Neg liver ROS, GERD  Medicated,  Endo/Other  negative endocrine ROS  Renal/GU negative Renal ROS  negative genitourinary   Musculoskeletal   Abdominal   Peds  Hematology negative hematology ROS (+)   Anesthesia Other Findings   Reproductive/Obstetrics negative OB ROS                             Anesthesia Physical Anesthesia Plan  ASA: II  Anesthesia Plan: General   Post-op Pain Management:    Induction:   PONV Risk Score and Plan: 4 or greater and Midazolam  Airway Management Planned:   Additional Equipment:   Intra-op Plan:   Post-operative Plan:   Informed Consent: I have reviewed the patients History and Physical, chart, labs and discussed the procedure including the risks, benefits and alternatives for the proposed anesthesia with the patient or authorized representative who has indicated his/her understanding and acceptance.     Plan Discussed with: CRNA  Anesthesia Plan Comments:         Anesthesia Quick Evaluation

## 2017-06-11 DIAGNOSIS — H2511 Age-related nuclear cataract, right eye: Secondary | ICD-10-CM | POA: Diagnosis not present

## 2017-06-17 ENCOUNTER — Other Ambulatory Visit: Payer: Self-pay

## 2017-06-17 ENCOUNTER — Encounter: Payer: Self-pay | Admitting: *Deleted

## 2017-06-18 NOTE — Discharge Instructions (Signed)

## 2017-06-19 DIAGNOSIS — K219 Gastro-esophageal reflux disease without esophagitis: Secondary | ICD-10-CM | POA: Diagnosis not present

## 2017-06-19 DIAGNOSIS — E039 Hypothyroidism, unspecified: Secondary | ICD-10-CM | POA: Diagnosis not present

## 2017-06-19 DIAGNOSIS — R490 Dysphonia: Secondary | ICD-10-CM | POA: Diagnosis not present

## 2017-06-19 DIAGNOSIS — R252 Cramp and spasm: Secondary | ICD-10-CM | POA: Diagnosis not present

## 2017-06-19 DIAGNOSIS — I10 Essential (primary) hypertension: Secondary | ICD-10-CM | POA: Diagnosis not present

## 2017-06-24 ENCOUNTER — Ambulatory Visit: Payer: PPO | Admitting: Anesthesiology

## 2017-06-24 ENCOUNTER — Encounter
Admission: RE | Disposition: A | Discharge status: Home / Self Care | Payer: Self-pay | Source: Ambulatory Visit | Attending: Ophthalmology

## 2017-06-24 ENCOUNTER — Ambulatory Visit
Admission: RE | Admit: 2017-06-24 | Discharge: 2017-06-24 | Disposition: A | Discharge status: Home / Self Care | Payer: PPO | Source: Ambulatory Visit | Attending: Ophthalmology | Admitting: Ophthalmology

## 2017-06-24 DIAGNOSIS — Z9849 Cataract extraction status, unspecified eye: Secondary | ICD-10-CM | POA: Insufficient documentation

## 2017-06-24 DIAGNOSIS — E079 Disorder of thyroid, unspecified: Secondary | ICD-10-CM | POA: Diagnosis not present

## 2017-06-24 DIAGNOSIS — H2511 Age-related nuclear cataract, right eye: Secondary | ICD-10-CM | POA: Diagnosis not present

## 2017-06-24 DIAGNOSIS — Z79899 Other long term (current) drug therapy: Secondary | ICD-10-CM | POA: Diagnosis not present

## 2017-06-24 DIAGNOSIS — Z79890 Hormone replacement therapy (postmenopausal): Secondary | ICD-10-CM | POA: Insufficient documentation

## 2017-06-24 DIAGNOSIS — Z87891 Personal history of nicotine dependence: Secondary | ICD-10-CM | POA: Insufficient documentation

## 2017-06-24 HISTORY — PX: CATARACT EXTRACTION W/PHACO: SHX586

## 2017-06-24 SURGERY — PHACOEMULSIFICATION, CATARACT, WITH IOL INSERTION
Anesthesia: Monitor Anesthesia Care | Site: Eye | Laterality: Right | Wound class: Clean

## 2017-06-24 MED ORDER — BRIMONIDINE TARTRATE-TIMOLOL 0.2-0.5 % OP SOLN
OPHTHALMIC | Status: DC | PRN
  Administered 2017-06-24: 1 [drp] via OPHTHALMIC

## 2017-06-24 MED ORDER — MOXIFLOXACIN HCL 0.5 % OP SOLN
1.0000 [drp] | OPHTHALMIC | Status: DC | PRN
  Administered 2017-06-24 (×3): 1 [drp] via OPHTHALMIC

## 2017-06-24 MED ORDER — LACTATED RINGERS IV SOLN: INTRAVENOUS | Status: DC

## 2017-06-24 MED ORDER — NA HYALUR & NA CHOND-NA HYALUR 0.4-0.35 ML IO KIT
PACK | INTRAOCULAR | Status: DC | PRN
  Administered 2017-06-24: 1 mL via INTRAOCULAR

## 2017-06-24 MED ORDER — LIDOCAINE HCL (PF) 2 % IJ SOLN
INTRAOCULAR | Status: DC | PRN
  Administered 2017-06-24: 1 mL via INTRAMUSCULAR

## 2017-06-24 MED ORDER — EPINEPHRINE PF 1 MG/ML IJ SOLN
INTRAOCULAR | Status: DC | PRN
  Administered 2017-06-24: 67 mL via OPHTHALMIC

## 2017-06-24 MED ORDER — ACETAMINOPHEN 325 MG PO TABS: 650.0000 mg | ORAL_TABLET | Freq: Once | ORAL | Status: DC | PRN

## 2017-06-24 MED ORDER — ARMC OPHTHALMIC DILATING DROPS
1.0000 "application " | OPHTHALMIC | Status: DC | PRN
  Administered 2017-06-24 (×3): 1 via OPHTHALMIC

## 2017-06-24 MED ORDER — ONDANSETRON HCL 4 MG/2ML IJ SOLN: 4.0000 mg | Freq: Once | INTRAMUSCULAR | Status: DC | PRN

## 2017-06-24 MED ORDER — ACETAMINOPHEN 160 MG/5ML PO SOLN: 325.0000 mg | ORAL | Status: DC | PRN

## 2017-06-24 MED ORDER — CEFUROXIME OPHTHALMIC INJECTION 1 MG/0.1 ML
INJECTION | OPHTHALMIC | Status: DC | PRN
  Administered 2017-06-24: .3 mL via OPHTHALMIC

## 2017-06-24 MED ORDER — MIDAZOLAM HCL 2 MG/2ML IJ SOLN
INTRAMUSCULAR | Status: DC | PRN
  Administered 2017-06-24: 2 mg via INTRAVENOUS

## 2017-06-24 MED ORDER — FENTANYL CITRATE (PF) 100 MCG/2ML IJ SOLN
INTRAMUSCULAR | Status: DC | PRN
  Administered 2017-06-24: 50 ug via INTRAVENOUS

## 2017-06-24 MED ORDER — LACTATED RINGERS IV SOLN: 500.0000 mL | INTRAVENOUS | Status: DC

## 2017-06-24 SURGICAL SUPPLY — 25 items
CANNULA ANT/CHMB 27GA (MISCELLANEOUS) ×3 IMPLANT
CARTRIDGE ABBOTT (MISCELLANEOUS) IMPLANT
GLOVE SURG LX 7.5 STRW (GLOVE) ×2
GLOVE SURG LX STRL 7.5 STRW (GLOVE) ×1 IMPLANT
GLOVE SURG TRIUMPH 8.0 PF LTX (GLOVE) ×3 IMPLANT
GOWN STRL REUS W/ TWL LRG LVL3 (GOWN DISPOSABLE) ×2 IMPLANT
GOWN STRL REUS W/TWL LRG LVL3 (GOWN DISPOSABLE) ×4
LENS IOL TECNIS ITEC 23.0 (Intraocular Lens) ×3 IMPLANT
MARKER SKIN DUAL TIP RULER LAB (MISCELLANEOUS) ×3 IMPLANT
NDL RETROBULBAR .5 NSTRL (NEEDLE) IMPLANT
NEEDLE FILTER BLUNT 18X 1/2SAF (NEEDLE) ×2
NEEDLE FILTER BLUNT 18X1 1/2 (NEEDLE) ×1 IMPLANT
PACK CATARACT BRASINGTON (MISCELLANEOUS) ×3 IMPLANT
PACK EYE AFTER SURG (MISCELLANEOUS) ×3 IMPLANT
PACK OPTHALMIC (MISCELLANEOUS) ×3 IMPLANT
RING MALYGIN 7.0 (MISCELLANEOUS) IMPLANT
SUT ETHILON 10-0 CS-B-6CS-B-6 (SUTURE)
SUT VICRYL  9 0 (SUTURE)
SUT VICRYL 9 0 (SUTURE) IMPLANT
SUTURE EHLN 10-0 CS-B-6CS-B-6 (SUTURE) IMPLANT
SYR 3ML LL SCALE MARK (SYRINGE) ×3 IMPLANT
SYR 5ML LL (SYRINGE) ×3 IMPLANT
SYR TB 1ML LUER SLIP (SYRINGE) ×3 IMPLANT
WATER STERILE IRR 500ML POUR (IV SOLUTION) ×3 IMPLANT
WIPE NON LINTING 3.25X3.25 (MISCELLANEOUS) ×3 IMPLANT

## 2017-06-24 NOTE — Transfer of Care (Signed)
Immediate Anesthesia Transfer of Care Note  Patient: Brandi Schwartz  Procedure(s) Performed: CATARACT EXTRACTION PHACO AND INTRAOCULAR LENS PLACEMENT (IOC) RIGHT (Right Eye)  Patient Location: PACU  Anesthesia Type: MAC  Level of Consciousness: awake, alert  and patient cooperative  Airway and Oxygen Therapy: Patient Spontanous Breathing and Patient connected to supplemental oxygen  Post-op Assessment: Post-op Vital signs reviewed, Patient's Cardiovascular Status Stable, Respiratory Function Stable, Patent Airway and No signs of Nausea or vomiting  Post-op Vital Signs: Reviewed and stable  Complications: No apparent anesthesia complications

## 2017-06-24 NOTE — Op Note (Signed)
LOCATION:  [place]   PREOPERATIVE DIAGNOSIS:    Nuclear sclerotic cataract right eye. H25.11   POSTOPERATIVE DIAGNOSIS:  Nuclear sclerotic cataract right eye.     PROCEDURE:  Phacoemusification with posterior chamber intraocular lens placement of the right eye   LENS:   Implant Name Type Inv. Item Serial No. Manufacturer Lot No. LRB No. Used  LENS IOL DIOP 23.0 - [id number] Intraocular Lens LENS IOL DIOP 23.0 [id number]  Right 1        ULTRASOUND TIME: 14 % of 1 minutes, 18 seconds.  CDE 10.9   SURGEON:  Wyonia Hough, MD   ANESTHESIA:  Topical with tetracaine drops and 2% Xylocaine jelly, augmented with 1% preservative-free intracameral lidocaine.    COMPLICATIONS:  None.   DESCRIPTION OF PROCEDURE:  The patient was identified in the holding room and transported to the operating room and placed in the supine position under the operating microscope.  The right eye was identified as the operative eye and it was prepped and draped in the usual sterile ophthalmic fashion.   A 1 millimeter clear-corneal paracentesis was made at the 12:00 position.  0.5 ml of preservative-free 1% lidocaine was injected into the anterior chamber. The anterior chamber was filled with Viscoat viscoelastic.  A 2.4 millimeter keratome was used to make a near-clear corneal incision at the 9:00 position.  A curvilinear capsulorrhexis was made with a cystotome and capsulorrhexis forceps.  Balanced salt solution was used to hydrodissect and hydrodelineate the nucleus.   Phacoemulsification was then used in stop and chop fashion to remove the lens nucleus and epinucleus.  The remaining cortex was then removed using the irrigation and aspiration handpiece. Provisc was then placed into the capsular bag to distend it for lens placement.  A lens was then injected into the capsular bag.  The remaining viscoelastic was aspirated.   Wounds were hydrated with balanced salt solution.  The anterior  chamber was inflated to a physiologic pressure with balanced salt solution.  No wound leaks were noted. Cefuroxime 0.1 ml of a 10mg /ml solution was injected into the anterior chamber for a dose of 1 mg of intracameral antibiotic at the completion of the case.   Timolol and Brimonidine drops were applied to the eye.  The patient was taken to the recovery room in stable condition without complications of anesthesia or surgery.   Garion Wempe 06/24/2017, 12:35 PM

## 2017-06-24 NOTE — Anesthesia Procedure Notes (Signed)
Procedure Name: MAC Performed by: Zi Newbury, CRNA Pre-anesthesia Checklist: Patient identified, Emergency Drugs available, Suction available, Timeout performed and Patient being monitored Patient Re-evaluated:Patient Re-evaluated prior to induction Oxygen Delivery Method: Nasal cannula Placement Confirmation: positive ETCO2       

## 2017-06-24 NOTE — Anesthesia Preprocedure Evaluation (Signed)
Anesthesia Evaluation  Patient identified by MRN, date of birth, ID band Patient awake    Reviewed: Allergy & Precautions, NPO status , Patient's Chart, lab work & pertinent test results  History of Anesthesia Complications (+) PONV and history of anesthetic complications  Airway Mallampati: I  TM Distance: >3 FB Neck ROM: Full    Dental  (+) Partial Upper, Partial Lower   Pulmonary former smoker (quit 2005),  Snoring    Pulmonary exam normal breath sounds clear to auscultation       Cardiovascular Exercise Tolerance: Good hypertension, Normal cardiovascular exam Rhythm:Regular Rate:Normal     Neuro/Psych Chronic hoarseness    GI/Hepatic GERD  ,  Endo/Other  Hypothyroidism   Renal/GU negative Renal ROS     Musculoskeletal   Abdominal   Peds  Hematology negative hematology ROS (+)   Anesthesia Other Findings   Reproductive/Obstetrics                             Anesthesia Physical Anesthesia Plan  ASA: II  Anesthesia Plan: MAC   Post-op Pain Management:    Induction: Intravenous  PONV Risk Score and Plan: 2 and Midazolam  Airway Management Planned: Natural Airway  Additional Equipment:   Intra-op Plan:   Post-operative Plan:   Informed Consent: I have reviewed the patients History and Physical, chart, labs and discussed the procedure including the risks, benefits and alternatives for the proposed anesthesia with the patient or authorized representative who has indicated his/her understanding and acceptance.     Plan Discussed with: CRNA  Anesthesia Plan Comments:         Anesthesia Quick Evaluation

## 2017-06-24 NOTE — Anesthesia Postprocedure Evaluation (Signed)
Anesthesia Post Note  Patient: Brandi Schwartz  Procedure(s) Performed: CATARACT EXTRACTION PHACO AND INTRAOCULAR LENS PLACEMENT (IOC) RIGHT (Right Eye)  Patient location during evaluation: PACU Anesthesia Type: MAC Level of consciousness: awake and alert, oriented and patient cooperative Pain management: pain level controlled Vital Signs Assessment: post-procedure vital signs reviewed and stable Respiratory status: spontaneous breathing, nonlabored ventilation and respiratory function stable Cardiovascular status: blood pressure returned to baseline and stable Postop Assessment: adequate PO intake Anesthetic complications: no    Darrin Nipper

## 2017-06-24 NOTE — H&P (Signed)
The History and Physical notes are on paper, have been signed, and are to be scanned. The patient remains stable and unchanged from the H&P.   Previous H&P reviewed, patient examined, and there are no changes.  Brandi Schwartz 06/24/2017 11:46 AM

## 2017-06-26 DIAGNOSIS — Z1231 Encounter for screening mammogram for malignant neoplasm of breast: Secondary | ICD-10-CM | POA: Diagnosis not present

## 2017-06-26 DIAGNOSIS — K219 Gastro-esophageal reflux disease without esophagitis: Secondary | ICD-10-CM | POA: Diagnosis not present

## 2017-06-26 DIAGNOSIS — I10 Essential (primary) hypertension: Secondary | ICD-10-CM | POA: Diagnosis not present

## 2017-06-26 DIAGNOSIS — Z0000 Encounter for general adult medical examination without abnormal findings: Secondary | ICD-10-CM | POA: Diagnosis not present

## 2017-06-26 DIAGNOSIS — E039 Hypothyroidism, unspecified: Secondary | ICD-10-CM | POA: Diagnosis not present

## 2017-07-09 ENCOUNTER — Other Ambulatory Visit: Payer: Self-pay | Admitting: Internal Medicine

## 2017-07-09 DIAGNOSIS — Z1231 Encounter for screening mammogram for malignant neoplasm of breast: Secondary | ICD-10-CM

## 2017-08-05 ENCOUNTER — Ambulatory Visit
Admission: RE | Admit: 2017-08-05 | Discharge: 2017-08-05 | Disposition: A | Discharge status: Home / Self Care | Payer: PPO | Source: Ambulatory Visit | Attending: Internal Medicine | Admitting: Internal Medicine

## 2017-08-05 DIAGNOSIS — Z1231 Encounter for screening mammogram for malignant neoplasm of breast: Secondary | ICD-10-CM | POA: Insufficient documentation

## 2017-12-18 DIAGNOSIS — Z0000 Encounter for general adult medical examination without abnormal findings: Secondary | ICD-10-CM | POA: Diagnosis not present

## 2017-12-18 DIAGNOSIS — I10 Essential (primary) hypertension: Secondary | ICD-10-CM | POA: Diagnosis not present

## 2017-12-18 DIAGNOSIS — E039 Hypothyroidism, unspecified: Secondary | ICD-10-CM | POA: Diagnosis not present

## 2017-12-18 DIAGNOSIS — K219 Gastro-esophageal reflux disease without esophagitis: Secondary | ICD-10-CM | POA: Diagnosis not present

## 2017-12-25 DIAGNOSIS — R490 Dysphonia: Secondary | ICD-10-CM | POA: Diagnosis not present

## 2017-12-25 DIAGNOSIS — E039 Hypothyroidism, unspecified: Secondary | ICD-10-CM | POA: Diagnosis not present

## 2017-12-25 DIAGNOSIS — I10 Essential (primary) hypertension: Secondary | ICD-10-CM | POA: Diagnosis not present

## 2017-12-25 DIAGNOSIS — K219 Gastro-esophageal reflux disease without esophagitis: Secondary | ICD-10-CM | POA: Diagnosis not present

## 2017-12-29 DIAGNOSIS — L309 Dermatitis, unspecified: Secondary | ICD-10-CM | POA: Diagnosis not present

## 2017-12-29 DIAGNOSIS — D2262 Melanocytic nevi of left upper limb, including shoulder: Secondary | ICD-10-CM | POA: Diagnosis not present

## 2017-12-29 DIAGNOSIS — D2261 Melanocytic nevi of right upper limb, including shoulder: Secondary | ICD-10-CM | POA: Diagnosis not present

## 2017-12-29 DIAGNOSIS — D2271 Melanocytic nevi of right lower limb, including hip: Secondary | ICD-10-CM | POA: Diagnosis not present

## 2017-12-29 DIAGNOSIS — X32XXXA Exposure to sunlight, initial encounter: Secondary | ICD-10-CM | POA: Diagnosis not present

## 2017-12-29 DIAGNOSIS — L570 Actinic keratosis: Secondary | ICD-10-CM | POA: Diagnosis not present

## 2017-12-29 DIAGNOSIS — D225 Melanocytic nevi of trunk: Secondary | ICD-10-CM | POA: Diagnosis not present

## 2017-12-29 DIAGNOSIS — D2272 Melanocytic nevi of left lower limb, including hip: Secondary | ICD-10-CM | POA: Diagnosis not present

## 2018-02-02 DIAGNOSIS — Z961 Presence of intraocular lens: Secondary | ICD-10-CM | POA: Diagnosis not present

## 2018-02-05 DIAGNOSIS — Z23 Encounter for immunization: Secondary | ICD-10-CM | POA: Diagnosis not present

## 2018-06-28 DIAGNOSIS — K219 Gastro-esophageal reflux disease without esophagitis: Secondary | ICD-10-CM | POA: Diagnosis not present

## 2018-06-28 DIAGNOSIS — I10 Essential (primary) hypertension: Secondary | ICD-10-CM | POA: Diagnosis not present

## 2018-06-28 DIAGNOSIS — E039 Hypothyroidism, unspecified: Secondary | ICD-10-CM | POA: Diagnosis not present

## 2018-07-02 DIAGNOSIS — E039 Hypothyroidism, unspecified: Secondary | ICD-10-CM | POA: Diagnosis not present

## 2018-07-02 DIAGNOSIS — I10 Essential (primary) hypertension: Secondary | ICD-10-CM | POA: Diagnosis not present

## 2018-07-02 DIAGNOSIS — Z0000 Encounter for general adult medical examination without abnormal findings: Secondary | ICD-10-CM | POA: Diagnosis not present

## 2018-07-02 DIAGNOSIS — R490 Dysphonia: Secondary | ICD-10-CM | POA: Diagnosis not present

## 2018-07-02 DIAGNOSIS — K219 Gastro-esophageal reflux disease without esophagitis: Secondary | ICD-10-CM | POA: Diagnosis not present

## 2018-08-02 ENCOUNTER — Other Ambulatory Visit: Payer: Self-pay | Admitting: Internal Medicine

## 2018-08-02 DIAGNOSIS — Z1231 Encounter for screening mammogram for malignant neoplasm of breast: Secondary | ICD-10-CM

## 2018-09-24 DIAGNOSIS — E039 Hypothyroidism, unspecified: Secondary | ICD-10-CM | POA: Diagnosis not present

## 2018-09-24 DIAGNOSIS — Z0000 Encounter for general adult medical examination without abnormal findings: Secondary | ICD-10-CM | POA: Diagnosis not present

## 2018-09-24 DIAGNOSIS — I10 Essential (primary) hypertension: Secondary | ICD-10-CM | POA: Diagnosis not present

## 2018-09-24 DIAGNOSIS — K219 Gastro-esophageal reflux disease without esophagitis: Secondary | ICD-10-CM | POA: Diagnosis not present

## 2018-09-24 DIAGNOSIS — R490 Dysphonia: Secondary | ICD-10-CM | POA: Diagnosis not present

## 2018-10-06 ENCOUNTER — Ambulatory Visit
Admission: RE | Admit: 2018-10-06 | Discharge: 2018-10-06 | Disposition: A | Discharge status: Home / Self Care | Payer: PPO | Source: Ambulatory Visit | Attending: Internal Medicine | Admitting: Internal Medicine

## 2018-10-06 ENCOUNTER — Other Ambulatory Visit: Payer: Self-pay

## 2018-10-06 DIAGNOSIS — Z1231 Encounter for screening mammogram for malignant neoplasm of breast: Secondary | ICD-10-CM | POA: Diagnosis not present

## 2018-10-28 DIAGNOSIS — K219 Gastro-esophageal reflux disease without esophagitis: Secondary | ICD-10-CM | POA: Diagnosis not present

## 2018-10-28 DIAGNOSIS — Z86010 Personal history of colonic polyps: Secondary | ICD-10-CM | POA: Diagnosis not present

## 2018-11-24 ENCOUNTER — Other Ambulatory Visit
Admission: RE | Admit: 2018-11-24 | Discharge: 2018-11-24 | Disposition: A | Discharge status: Home / Self Care | Payer: PPO | Source: Ambulatory Visit | Attending: Internal Medicine | Admitting: Internal Medicine

## 2018-11-24 ENCOUNTER — Other Ambulatory Visit: Payer: Self-pay

## 2018-11-24 DIAGNOSIS — Z1159 Encounter for screening for other viral diseases: Secondary | ICD-10-CM | POA: Insufficient documentation

## 2018-11-24 LAB — SARS CORONAVIRUS 2 (TAT 6-24 HRS): SARS Coronavirus 2: NEGATIVE

## 2018-11-29 ENCOUNTER — Encounter: Payer: Self-pay | Admitting: *Deleted

## 2018-11-29 ENCOUNTER — Ambulatory Visit: Payer: PPO | Admitting: Certified Registered Nurse Anesthetist

## 2018-11-29 ENCOUNTER — Encounter
Admission: RE | Disposition: A | Discharge status: Home / Self Care | Payer: Self-pay | Source: Home / Self Care | Attending: Internal Medicine

## 2018-11-29 ENCOUNTER — Ambulatory Visit
Admission: RE | Admit: 2018-11-29 | Discharge: 2018-11-29 | Disposition: A | Discharge status: Home / Self Care | Payer: PPO | Attending: Internal Medicine | Admitting: Internal Medicine

## 2018-11-29 ENCOUNTER — Other Ambulatory Visit: Payer: Self-pay

## 2018-11-29 DIAGNOSIS — K640 First degree hemorrhoids: Secondary | ICD-10-CM | POA: Diagnosis not present

## 2018-11-29 DIAGNOSIS — K219 Gastro-esophageal reflux disease without esophagitis: Secondary | ICD-10-CM | POA: Insufficient documentation

## 2018-11-29 DIAGNOSIS — E039 Hypothyroidism, unspecified: Secondary | ICD-10-CM | POA: Diagnosis not present

## 2018-11-29 DIAGNOSIS — Q438 Other specified congenital malformations of intestine: Secondary | ICD-10-CM | POA: Diagnosis not present

## 2018-11-29 DIAGNOSIS — Z79890 Hormone replacement therapy (postmenopausal): Secondary | ICD-10-CM | POA: Insufficient documentation

## 2018-11-29 DIAGNOSIS — Z79899 Other long term (current) drug therapy: Secondary | ICD-10-CM | POA: Diagnosis not present

## 2018-11-29 DIAGNOSIS — D120 Benign neoplasm of cecum: Secondary | ICD-10-CM | POA: Diagnosis not present

## 2018-11-29 DIAGNOSIS — Z87891 Personal history of nicotine dependence: Secondary | ICD-10-CM | POA: Insufficient documentation

## 2018-11-29 DIAGNOSIS — K648 Other hemorrhoids: Secondary | ICD-10-CM | POA: Diagnosis not present

## 2018-11-29 DIAGNOSIS — D126 Benign neoplasm of colon, unspecified: Secondary | ICD-10-CM | POA: Diagnosis not present

## 2018-11-29 DIAGNOSIS — K5730 Diverticulosis of large intestine without perforation or abscess without bleeding: Secondary | ICD-10-CM | POA: Insufficient documentation

## 2018-11-29 DIAGNOSIS — Z1211 Encounter for screening for malignant neoplasm of colon: Secondary | ICD-10-CM | POA: Diagnosis not present

## 2018-11-29 DIAGNOSIS — I10 Essential (primary) hypertension: Secondary | ICD-10-CM | POA: Diagnosis not present

## 2018-11-29 DIAGNOSIS — K5790 Diverticulosis of intestine, part unspecified, without perforation or abscess without bleeding: Secondary | ICD-10-CM | POA: Diagnosis not present

## 2018-11-29 DIAGNOSIS — Z86010 Personal history of colonic polyps: Secondary | ICD-10-CM | POA: Insufficient documentation

## 2018-11-29 DIAGNOSIS — Z09 Encounter for follow-up examination after completed treatment for conditions other than malignant neoplasm: Secondary | ICD-10-CM | POA: Diagnosis not present

## 2018-11-29 HISTORY — PX: COLONOSCOPY WITH PROPOFOL: SHX5780

## 2018-11-29 SURGERY — COLONOSCOPY WITH PROPOFOL
Anesthesia: General

## 2018-11-29 MED ORDER — SODIUM CHLORIDE 0.9 % IV SOLN
INTRAVENOUS | Status: DC
  Administered 2018-11-29: 13:00:00 via INTRAVENOUS

## 2018-11-29 MED ORDER — PROPOFOL 500 MG/50ML IV EMUL
INTRAVENOUS | Status: DC | PRN
  Administered 2018-11-29: 150 ug/kg/min via INTRAVENOUS

## 2018-11-29 MED ORDER — LIDOCAINE HCL (PF) 2 % IJ SOLN
INTRAMUSCULAR | Status: AC
  Filled 2018-11-29: qty 10

## 2018-11-29 MED ORDER — PROPOFOL 10 MG/ML IV BOLUS
INTRAVENOUS | Status: AC
  Filled 2018-11-29: qty 20

## 2018-11-29 MED ORDER — EPHEDRINE SULFATE 50 MG/ML IJ SOLN
INTRAMUSCULAR | Status: AC
  Filled 2018-11-29: qty 1

## 2018-11-29 MED ORDER — LIDOCAINE HCL (CARDIAC) PF 100 MG/5ML IV SOSY
PREFILLED_SYRINGE | INTRAVENOUS | Status: DC | PRN
  Administered 2018-11-29: 80 mg via INTRATRACHEAL

## 2018-11-29 MED ORDER — PROPOFOL 10 MG/ML IV BOLUS
INTRAVENOUS | Status: DC | PRN
  Administered 2018-11-29 (×2): 20 mg via INTRAVENOUS
  Administered 2018-11-29 (×2): 30 mg via INTRAVENOUS

## 2018-11-29 MED ORDER — EPHEDRINE SULFATE 50 MG/ML IJ SOLN
INTRAMUSCULAR | Status: DC | PRN
  Administered 2018-11-29: 5 mg via INTRAVENOUS

## 2018-11-29 NOTE — H&P (Signed)
Outpatient short stay form Pre-procedure 11/29/2018 1:25 PM [name] Alice Reichert, M.D.  Primary Physician: Tracie Harrier, M.D.  Reason for visit:  Personal hx of colon polyps  History of present illness:                           Patient presents for colonoscopy for a personal hx of colon polyps. The patient denies abdominal pain, abnormal weight loss or rectal bleeding.     Current Facility-Administered Medications:  .  0.9 %  sodium chloride infusion, , Intravenous, Continuous, [place], Benay Pike, MD  Facility-Administered Medications Ordered in Other Encounters:  .  lidocaine (cardiac) 100 mg/45mL (XYLOCAINE) injection 2%, , Tracheal Tube, Anesthesia Intra-op, Rosebud Poles C, CRNA, 80 mg at 11/29/18 1322 .  propofol (DIPRIVAN) 10 mg/mL bolus/IV push, , , Anesthesia Intra-op, Rosebud Poles C, CRNA, 20 mg at 11/29/18 1325 .  propofol (DIPRIVAN) 500 MG/50ML infusion, , , Continuous PRN, Caryl Asp, CRNA, Last Rate: 77.6 mL/hr at 11/29/18 1325, 150 mcg/kg/min at 11/29/18 1325  Medications Prior to Admission  Medication Sig Dispense Refill Last Dose  . amLODipine (NORVASC) 5 MG tablet Take 5 mg by mouth daily.   11/28/2018 at 0800  . hydrochlorothiazide (HYDRODIURIL) 12.5 MG tablet Take 12.5 mg by mouth daily.   11/28/2018 at 0800  . levothyroxine (SYNTHROID, LEVOTHROID) 112 MCG tablet Take 112 mcg by mouth daily before breakfast.   11/28/2018 at 0800  . losartan (COZAAR) 100 MG tablet Take 100 mg by mouth daily.   11/29/2018 at 0800  . omeprazole (PRILOSEC) 20 MG capsule Take 20 mg by mouth daily.   11/28/2018 at 0800  . Vitamin D, Ergocalciferol, (DRISDOL) 50000 units CAPS capsule Take 50,000 Units by mouth every 7 (seven) days.   Past Week at .     Allergies  Allergen Reactions  . Codeine Other (See Comments)    ABD.PAIN     Past Medical History:  Diagnosis Date  . GERD (gastroesophageal reflux disease)   . History of colonic polyps   . History of shingles   . Hoarseness,  chronic   . Hypertension   . Hypothyroidism   . Osteoporosis   . PONV (postoperative nausea and vomiting)    many yrs ago.  None recently  . Wears dentures    full upper    Review of systems:  Otherwise negative.    Physical Exam  Gen: Alert, oriented. Appears stated age.  HEENT: Brandi Schwartz/AT. PERRLA. Lungs: CTA, no wheezes. CV: RR nl S1, S2. Abd: soft, benign, no masses. BS+ Ext: No edema. Pulses 2+    Planned procedures: Proceed with colonoscopy. The patient understands the nature of the planned procedure, indications, risks, alternatives and potential complications including but not limited to bleeding, infection, perforation, damage to internal organs and possible oversedation/side effects from anesthesia. The patient agrees and gives consent to proceed.  Please refer to procedure notes for findings, recommendations and patient disposition/instructions.     [name] Alice Reichert, M.D. Gastroenterology 11/29/2018  1:25 PM

## 2018-11-29 NOTE — Anesthesia Postprocedure Evaluation (Signed)
Anesthesia Post Note  Patient: DEONDRIA PURYEAR  Procedure(s) Performed: COLONOSCOPY WITH PROPOFOL (N/A )  Patient location during evaluation: Endoscopy Anesthesia Type: General Level of consciousness: awake and alert Pain management: pain level controlled Vital Signs Assessment: post-procedure vital signs reviewed and stable Respiratory status: spontaneous breathing, nonlabored ventilation, respiratory function stable and patient connected to nasal cannula oxygen Cardiovascular status: blood pressure returned to baseline and stable Postop Assessment: no apparent nausea or vomiting Anesthetic complications: no     Last Vitals:  Vitals:   11/29/18 1411 11/29/18 1415  BP: (!) 143/72 (!) 143/72  Pulse: 70 71  Resp: (!) 22 (!) 23  Temp:    SpO2: 99% 99%    Last Pain:  Vitals:   11/29/18 1415  TempSrc:   PainSc: 0-No pain                 Precious Haws Piscitello

## 2018-11-29 NOTE — Transfer of Care (Signed)
Immediate Anesthesia Transfer of Care Note  Patient: Brandi Schwartz  Procedure(s) Performed: COLONOSCOPY WITH PROPOFOL (N/A )  Patient Location: PACU  Anesthesia Type:General  Level of Consciousness: awake, alert  and oriented  Airway & Oxygen Therapy: Patient Spontanous Breathing and Patient connected to nasal cannula oxygen  Post-op Assessment: Report given to RN, Post -op Vital signs reviewed and stable and Patient moving all extremities X 4  Post vital signs: Reviewed and stable  Last Vitals:  Vitals Value Taken Time  BP    Temp    Pulse 73 11/29/18 1353  Resp 26 11/29/18 1353  SpO2 98 % 11/29/18 1353  Vitals shown include unvalidated device data.  Last Pain:  Vitals:   11/29/18 1228  PainSc: 0-No pain         Complications: No apparent anesthesia complications

## 2018-11-29 NOTE — Anesthesia Preprocedure Evaluation (Signed)
Anesthesia Evaluation  Patient identified by MRN, date of birth, ID band Patient awake    Reviewed: Allergy & Precautions, H&P , NPO status , Patient's Chart, lab work & pertinent test results  History of Anesthesia Complications (+) PONV and history of anesthetic complications  Airway Mallampati: II  TM Distance: >3 FB Neck ROM: full    Dental  (+) Chipped, Upper Dentures   Pulmonary neg shortness of breath, former smoker,           Cardiovascular Exercise Tolerance: Good hypertension, (-) angina(-) Past MI and (-) DOE      Neuro/Psych negative neurological ROS  negative psych ROS   GI/Hepatic Neg liver ROS, GERD  Medicated and Controlled,  Endo/Other  Hypothyroidism   Renal/GU negative Renal ROS  negative genitourinary   Musculoskeletal   Abdominal   Peds  Hematology negative hematology ROS (+)   Anesthesia Other Findings Past Medical History: No date: GERD (gastroesophageal reflux disease) No date: History of colonic polyps No date: History of shingles No date: Hoarseness, chronic No date: Hypertension No date: Hypothyroidism No date: Osteoporosis No date: PONV (postoperative nausea and vomiting)     Comment:  many yrs ago.  None recently No date: Wears dentures     Comment:  full upper  Past Surgical History: No date: ABDOMINAL HYSTERECTOMY 2005: BREAST EXCISIONAL BIOPSY; Left     Comment:  NEG 05/27/2017: CATARACT EXTRACTION W/PHACO; Left     Comment:  Procedure: CATARACT EXTRACTION PHACO AND INTRAOCULAR               LENS PLACEMENT ([place]) LEFT;  Surgeon: Leandrew Koyanagi, MD;  Location: [place];  Service:               Ophthalmology;  Laterality: Left; 06/24/2017: CATARACT EXTRACTION W/PHACO; Right     Comment:  Procedure: CATARACT EXTRACTION PHACO AND INTRAOCULAR               LENS PLACEMENT ([place]) RIGHT;  Surgeon: Leandrew Koyanagi, MD;  Location:  [place];  Service:               Ophthalmology;  Laterality: Right;  cannot arrive before               830AM No date: COLONOSCOPY 07/09/2016: COLONOSCOPY WITH PROPOFOL; N/A     Comment:  Procedure: COLONOSCOPY WITH PROPOFOL;  Surgeon: Manya Silvas, MD;  Location: [facility] ENDOSCOPY;  Service:               Endoscopy;  Laterality: N/A; No date: ESOPHAGOGASTRODUODENOSCOPY 07/09/2016: ESOPHAGOGASTRODUODENOSCOPY; N/A     Comment:  Procedure: ESOPHAGOGASTRODUODENOSCOPY (EGD);  Surgeon:               Manya Silvas, MD;  Location: [facility] ENDOSCOPY;                Service: Endoscopy;  Laterality: N/A;  BMI    Body Mass Index: 31.14 kg/m      Reproductive/Obstetrics negative OB ROS                             Anesthesia Physical Anesthesia Plan  ASA: III  Anesthesia Plan: General   Post-op Pain Management:  Induction: Intravenous  PONV Risk Score and Plan: Propofol infusion and TIVA  Airway Management Planned: Natural Airway and Nasal Cannula  Additional Equipment:   Intra-op Plan:   Post-operative Plan:   Informed Consent: I have reviewed the patients History and Physical, chart, labs and discussed the procedure including the risks, benefits and alternatives for the proposed anesthesia with the patient or authorized representative who has indicated his/her understanding and acceptance.     Dental Advisory Given  Plan Discussed with: Anesthesiologist, CRNA and Surgeon  Anesthesia Plan Comments: (Patient consented for risks of anesthesia including but not limited to:  - adverse reactions to medications - risk of intubation if required - damage to teeth, lips or other oral mucosa - sore throat or hoarseness - Damage to heart, brain, lungs or loss of life  Patient voiced understanding.)        Anesthesia Quick Evaluation

## 2018-11-29 NOTE — Op Note (Signed)
[facility] Gastroenterology Patient Name: Brandi Schwartz Procedure Date: 11/29/2018 1:20 PM MRN: [id number] Account #: [id number] Date of Birth: 03/24/1946 Admit Type: Outpatient Age: 73 Room: [facility] ENDO ROOM 4 Gender: Female Note Status: Finalized Procedure:            Colonoscopy Indications:          Surveillance: Piecemeal removal of large sessile                        adenoma last colonoscopy (< 3 yrs) Providers:            Benay Pike. Alice Reichert MD, MD Referring MD:         Tracie Harrier, MD (Referring MD) Medicines:            Propofol per Anesthesia Complications:        No immediate complications. Procedure:            Pre-Anesthesia Assessment:                       - The risks and benefits of the procedure and the                        sedation options and risks were discussed with the                        patient. All questions were answered and informed                        consent was obtained.                       - Patient identification and proposed procedure were                        verified prior to the procedure by the nurse. The                        procedure was verified in the procedure room.                       - ASA Grade Assessment: III - A patient with severe                        systemic disease.                       - After reviewing the risks and benefits, the patient                        was deemed in satisfactory condition to undergo the                        procedure.                       After obtaining informed consent, the colonoscope was                        passed under direct vision. Throughout the procedure,  the patient's blood pressure, pulse, and oxygen                        saturations were monitored continuously. The                        Colonoscope was introduced through the anus and                        advanced to the the cecum, identified by appendiceal            orifice and ileocecal valve. The colonoscopy was                        technically difficult and complex due to restricted                        mobility of the colon, a redundant colon and                        significant looping. Successful completion of the                        procedure was aided by straightening and shortening the                        scope to obtain bowel loop reduction and applying                        abdominal pressure. The patient tolerated the procedure                        well. The quality of the bowel preparation was good.                        The ileocecal valve, appendiceal orifice, and rectum                        were photographed. Findings:      The perianal and digital rectal examinations were normal. Pertinent       negatives include normal sphincter tone and no palpable rectal lesions.      Many small-mouthed diverticula were found in the sigmoid colon.      The left colon was significantly tortuous. Advancing the scope required       using manual pressure.      A 6 mm polyp was found in the cecum. The polyp was sessile. This is       presumably the area of previous piecemeal polypectomy in 2018. The polyp       was removed with a cold snare. Resection and retrieval were complete.      Non-bleeding internal hemorrhoids were found during retroflexion. The       hemorrhoids were Grade I (internal hemorrhoids that do not prolapse).      The exam was otherwise without abnormality. Impression:           - Diverticulosis in the sigmoid colon.                       - Tortuous colon.                       -  One 6 mm polyp in the cecum, removed with a cold                        snare. Resected and retrieved.                       - Non-bleeding internal hemorrhoids.                       - The examination was otherwise normal. Recommendation:       - Patient has a contact number available for                        emergencies. The  signs and symptoms of potential                        delayed complications were discussed with the patient.                        Return to normal activities tomorrow. Written discharge                        instructions were provided to the patient.                       - Resume previous diet.                       - Continue present medications.                       - Repeat colonoscopy is recommended for surveillance.                        The colonoscopy date will be determined after pathology                        results from today's exam become available for review.                       - Return to GI office PRN.                       - The findings and recommendations were discussed with                        the patient. Procedure Code(s):    --- Professional ---                       [phone], Colonoscopy, flexible; with removal of tumor(s),                        polyp(s), or other lesion(s) by snare technique Diagnosis Code(s):    --- Professional ---                       Q43.8, Other specified congenital malformations of                        intestine  K57.30, Diverticulosis of large intestine without                        perforation or abscess without bleeding                       K63.5, Polyp of colon                       K64.0, First degree hemorrhoids                       Z86.010, Personal history of colonic polyps CPT copyright 2019 American Medical Association. All rights reserved. The codes documented in this report are preliminary and upon coder review may  be revised to meet current compliance requirements. Efrain Sella MD, MD 11/29/2018 1:52:38 PM This report has been signed electronically. Number of Addenda: 0 Note Initiated On: 11/29/2018 1:20 PM Scope Withdrawal Time: 0 hours 7 minutes 20 seconds  Total Procedure Duration: 0 hours 17 minutes 9 seconds  Estimated Blood Loss: Estimated blood loss was minimal.       [facility]

## 2018-11-29 NOTE — Anesthesia Post-op Follow-up Note (Signed)
Anesthesia QCDR form completed.        

## 2018-11-29 NOTE — Interval H&P Note (Signed)
History and Physical Interval Note:  11/29/2018 1:26 PM  Brandi Schwartz  has presented today for surgery, with the diagnosis of History of Adenomatous Polyps.  The various methods of treatment have been discussed with the patient and family. After consideration of risks, benefits and other options for treatment, the patient has consented to  Procedure(s): COLONOSCOPY WITH PROPOFOL (N/A) as a surgical intervention.  The patient's history has been reviewed, patient examined, no change in status, stable for surgery.  I have reviewed the patient's chart and labs.  Questions were answered to the patient's satisfaction.     [place], [place]

## 2018-12-01 LAB — SURGICAL PATHOLOGY

## 2018-12-24 DIAGNOSIS — E039 Hypothyroidism, unspecified: Secondary | ICD-10-CM | POA: Diagnosis not present

## 2018-12-24 DIAGNOSIS — R490 Dysphonia: Secondary | ICD-10-CM | POA: Diagnosis not present

## 2018-12-24 DIAGNOSIS — Z0000 Encounter for general adult medical examination without abnormal findings: Secondary | ICD-10-CM | POA: Diagnosis not present

## 2018-12-24 DIAGNOSIS — K219 Gastro-esophageal reflux disease without esophagitis: Secondary | ICD-10-CM | POA: Diagnosis not present

## 2018-12-24 DIAGNOSIS — I10 Essential (primary) hypertension: Secondary | ICD-10-CM | POA: Diagnosis not present

## 2018-12-28 DIAGNOSIS — D2271 Melanocytic nevi of right lower limb, including hip: Secondary | ICD-10-CM | POA: Diagnosis not present

## 2018-12-28 DIAGNOSIS — L538 Other specified erythematous conditions: Secondary | ICD-10-CM | POA: Diagnosis not present

## 2018-12-28 DIAGNOSIS — D2272 Melanocytic nevi of left lower limb, including hip: Secondary | ICD-10-CM | POA: Diagnosis not present

## 2018-12-28 DIAGNOSIS — D2262 Melanocytic nevi of left upper limb, including shoulder: Secondary | ICD-10-CM | POA: Diagnosis not present

## 2018-12-28 DIAGNOSIS — L820 Inflamed seborrheic keratosis: Secondary | ICD-10-CM | POA: Diagnosis not present

## 2018-12-28 DIAGNOSIS — D225 Melanocytic nevi of trunk: Secondary | ICD-10-CM | POA: Diagnosis not present

## 2018-12-28 DIAGNOSIS — L821 Other seborrheic keratosis: Secondary | ICD-10-CM | POA: Diagnosis not present

## 2018-12-28 DIAGNOSIS — D2261 Melanocytic nevi of right upper limb, including shoulder: Secondary | ICD-10-CM | POA: Diagnosis not present

## 2018-12-28 DIAGNOSIS — L720 Epidermal cyst: Secondary | ICD-10-CM | POA: Diagnosis not present

## 2018-12-31 DIAGNOSIS — R110 Nausea: Secondary | ICD-10-CM | POA: Diagnosis not present

## 2018-12-31 DIAGNOSIS — R5381 Other malaise: Secondary | ICD-10-CM | POA: Diagnosis not present

## 2018-12-31 DIAGNOSIS — E039 Hypothyroidism, unspecified: Secondary | ICD-10-CM | POA: Diagnosis not present

## 2018-12-31 DIAGNOSIS — G8929 Other chronic pain: Secondary | ICD-10-CM | POA: Diagnosis not present

## 2018-12-31 DIAGNOSIS — I10 Essential (primary) hypertension: Secondary | ICD-10-CM | POA: Diagnosis not present

## 2018-12-31 DIAGNOSIS — M25551 Pain in right hip: Secondary | ICD-10-CM | POA: Diagnosis not present

## 2018-12-31 DIAGNOSIS — K219 Gastro-esophageal reflux disease without esophagitis: Secondary | ICD-10-CM | POA: Diagnosis not present

## 2018-12-31 DIAGNOSIS — R5383 Other fatigue: Secondary | ICD-10-CM | POA: Diagnosis not present

## 2019-01-21 DIAGNOSIS — Z23 Encounter for immunization: Secondary | ICD-10-CM | POA: Diagnosis not present

## 2019-05-26 DIAGNOSIS — H04123 Dry eye syndrome of bilateral lacrimal glands: Secondary | ICD-10-CM | POA: Diagnosis not present

## 2019-06-29 DIAGNOSIS — K219 Gastro-esophageal reflux disease without esophagitis: Secondary | ICD-10-CM | POA: Diagnosis not present

## 2019-06-29 DIAGNOSIS — M25551 Pain in right hip: Secondary | ICD-10-CM | POA: Diagnosis not present

## 2019-06-29 DIAGNOSIS — R5383 Other fatigue: Secondary | ICD-10-CM | POA: Diagnosis not present

## 2019-06-29 DIAGNOSIS — I10 Essential (primary) hypertension: Secondary | ICD-10-CM | POA: Diagnosis not present

## 2019-06-29 DIAGNOSIS — E039 Hypothyroidism, unspecified: Secondary | ICD-10-CM | POA: Diagnosis not present

## 2019-06-29 DIAGNOSIS — G8929 Other chronic pain: Secondary | ICD-10-CM | POA: Diagnosis not present

## 2019-06-29 DIAGNOSIS — R5381 Other malaise: Secondary | ICD-10-CM | POA: Diagnosis not present

## 2019-07-06 DIAGNOSIS — Z0000 Encounter for general adult medical examination without abnormal findings: Secondary | ICD-10-CM | POA: Diagnosis not present

## 2019-07-06 DIAGNOSIS — M25551 Pain in right hip: Secondary | ICD-10-CM | POA: Diagnosis not present

## 2019-07-06 DIAGNOSIS — M8580 Other specified disorders of bone density and structure, unspecified site: Secondary | ICD-10-CM | POA: Diagnosis not present

## 2019-07-06 DIAGNOSIS — Z780 Asymptomatic menopausal state: Secondary | ICD-10-CM | POA: Diagnosis not present

## 2019-07-06 DIAGNOSIS — K219 Gastro-esophageal reflux disease without esophagitis: Secondary | ICD-10-CM | POA: Diagnosis not present

## 2019-07-06 DIAGNOSIS — G8929 Other chronic pain: Secondary | ICD-10-CM | POA: Diagnosis not present

## 2019-07-06 DIAGNOSIS — R0600 Dyspnea, unspecified: Secondary | ICD-10-CM | POA: Diagnosis not present

## 2019-07-06 DIAGNOSIS — I10 Essential (primary) hypertension: Secondary | ICD-10-CM | POA: Diagnosis not present

## 2019-07-06 DIAGNOSIS — E039 Hypothyroidism, unspecified: Secondary | ICD-10-CM | POA: Diagnosis not present

## 2019-07-11 DIAGNOSIS — M810 Age-related osteoporosis without current pathological fracture: Secondary | ICD-10-CM | POA: Diagnosis not present

## 2019-07-14 DIAGNOSIS — Z8249 Family history of ischemic heart disease and other diseases of the circulatory system: Secondary | ICD-10-CM | POA: Diagnosis not present

## 2019-07-14 DIAGNOSIS — I10 Essential (primary) hypertension: Secondary | ICD-10-CM | POA: Diagnosis not present

## 2019-07-14 DIAGNOSIS — R0600 Dyspnea, unspecified: Secondary | ICD-10-CM | POA: Diagnosis not present

## 2019-07-14 DIAGNOSIS — R9431 Abnormal electrocardiogram [ECG] [EKG]: Secondary | ICD-10-CM | POA: Diagnosis not present

## 2019-07-14 DIAGNOSIS — R5383 Other fatigue: Secondary | ICD-10-CM | POA: Diagnosis not present

## 2019-08-10 DIAGNOSIS — R0600 Dyspnea, unspecified: Secondary | ICD-10-CM | POA: Diagnosis not present

## 2019-08-10 DIAGNOSIS — I10 Essential (primary) hypertension: Secondary | ICD-10-CM | POA: Diagnosis not present

## 2019-08-25 DIAGNOSIS — I10 Essential (primary) hypertension: Secondary | ICD-10-CM | POA: Diagnosis not present

## 2019-08-25 DIAGNOSIS — R0600 Dyspnea, unspecified: Secondary | ICD-10-CM | POA: Diagnosis not present

## 2019-11-16 ENCOUNTER — Other Ambulatory Visit: Payer: Self-pay | Admitting: Internal Medicine

## 2019-11-16 DIAGNOSIS — Z1231 Encounter for screening mammogram for malignant neoplasm of breast: Secondary | ICD-10-CM

## 2019-12-06 ENCOUNTER — Ambulatory Visit
Admission: RE | Admit: 2019-12-06 | Discharge: 2019-12-06 | Disposition: A | Discharge status: Home / Self Care | Payer: PPO | Source: Ambulatory Visit | Attending: Internal Medicine | Admitting: Internal Medicine

## 2019-12-06 ENCOUNTER — Other Ambulatory Visit: Payer: Self-pay

## 2019-12-06 DIAGNOSIS — Z1231 Encounter for screening mammogram for malignant neoplasm of breast: Secondary | ICD-10-CM | POA: Diagnosis not present

## 2019-12-27 DIAGNOSIS — D2262 Melanocytic nevi of left upper limb, including shoulder: Secondary | ICD-10-CM | POA: Diagnosis not present

## 2019-12-27 DIAGNOSIS — L821 Other seborrheic keratosis: Secondary | ICD-10-CM | POA: Diagnosis not present

## 2019-12-27 DIAGNOSIS — D2261 Melanocytic nevi of right upper limb, including shoulder: Secondary | ICD-10-CM | POA: Diagnosis not present

## 2019-12-27 DIAGNOSIS — D2271 Melanocytic nevi of right lower limb, including hip: Secondary | ICD-10-CM | POA: Diagnosis not present

## 2019-12-27 DIAGNOSIS — L570 Actinic keratosis: Secondary | ICD-10-CM | POA: Diagnosis not present

## 2019-12-27 DIAGNOSIS — D225 Melanocytic nevi of trunk: Secondary | ICD-10-CM | POA: Diagnosis not present

## 2019-12-27 DIAGNOSIS — D485 Neoplasm of uncertain behavior of skin: Secondary | ICD-10-CM | POA: Diagnosis not present

## 2019-12-27 DIAGNOSIS — D2272 Melanocytic nevi of left lower limb, including hip: Secondary | ICD-10-CM | POA: Diagnosis not present

## 2020-01-02 DIAGNOSIS — L570 Actinic keratosis: Secondary | ICD-10-CM | POA: Diagnosis not present

## 2020-01-02 DIAGNOSIS — X32XXXA Exposure to sunlight, initial encounter: Secondary | ICD-10-CM | POA: Diagnosis not present

## 2020-01-03 DIAGNOSIS — E039 Hypothyroidism, unspecified: Secondary | ICD-10-CM | POA: Diagnosis not present

## 2020-01-03 DIAGNOSIS — Z0000 Encounter for general adult medical examination without abnormal findings: Secondary | ICD-10-CM | POA: Diagnosis not present

## 2020-01-03 DIAGNOSIS — I10 Essential (primary) hypertension: Secondary | ICD-10-CM | POA: Diagnosis not present

## 2020-01-03 DIAGNOSIS — M25551 Pain in right hip: Secondary | ICD-10-CM | POA: Diagnosis not present

## 2020-01-03 DIAGNOSIS — G8929 Other chronic pain: Secondary | ICD-10-CM | POA: Diagnosis not present

## 2020-01-03 DIAGNOSIS — R0600 Dyspnea, unspecified: Secondary | ICD-10-CM | POA: Diagnosis not present

## 2020-01-03 DIAGNOSIS — K219 Gastro-esophageal reflux disease without esophagitis: Secondary | ICD-10-CM | POA: Diagnosis not present

## 2020-01-10 DIAGNOSIS — E039 Hypothyroidism, unspecified: Secondary | ICD-10-CM | POA: Diagnosis not present

## 2020-01-10 DIAGNOSIS — D171 Benign lipomatous neoplasm of skin and subcutaneous tissue of trunk: Secondary | ICD-10-CM | POA: Diagnosis not present

## 2020-01-10 DIAGNOSIS — Z23 Encounter for immunization: Secondary | ICD-10-CM | POA: Diagnosis not present

## 2020-01-10 DIAGNOSIS — M545 Low back pain: Secondary | ICD-10-CM | POA: Diagnosis not present

## 2020-01-10 DIAGNOSIS — I10 Essential (primary) hypertension: Secondary | ICD-10-CM | POA: Diagnosis not present

## 2020-06-01 DIAGNOSIS — H04123 Dry eye syndrome of bilateral lacrimal glands: Secondary | ICD-10-CM | POA: Diagnosis not present

## 2020-07-02 DIAGNOSIS — R0600 Dyspnea, unspecified: Secondary | ICD-10-CM | POA: Diagnosis not present

## 2020-07-02 DIAGNOSIS — K219 Gastro-esophageal reflux disease without esophagitis: Secondary | ICD-10-CM | POA: Diagnosis not present

## 2020-07-02 DIAGNOSIS — Z0000 Encounter for general adult medical examination without abnormal findings: Secondary | ICD-10-CM | POA: Diagnosis not present

## 2020-07-02 DIAGNOSIS — Z1329 Encounter for screening for other suspected endocrine disorder: Secondary | ICD-10-CM | POA: Diagnosis not present

## 2020-07-02 DIAGNOSIS — Z136 Encounter for screening for cardiovascular disorders: Secondary | ICD-10-CM | POA: Diagnosis not present

## 2020-07-02 DIAGNOSIS — I10 Essential (primary) hypertension: Secondary | ICD-10-CM | POA: Diagnosis not present

## 2020-07-02 DIAGNOSIS — E039 Hypothyroidism, unspecified: Secondary | ICD-10-CM | POA: Diagnosis not present

## 2020-07-02 DIAGNOSIS — R7989 Other specified abnormal findings of blood chemistry: Secondary | ICD-10-CM | POA: Diagnosis not present

## 2020-07-02 DIAGNOSIS — Z131 Encounter for screening for diabetes mellitus: Secondary | ICD-10-CM | POA: Diagnosis not present

## 2020-07-09 DIAGNOSIS — Z0000 Encounter for general adult medical examination without abnormal findings: Secondary | ICD-10-CM | POA: Diagnosis not present

## 2020-07-09 DIAGNOSIS — K219 Gastro-esophageal reflux disease without esophagitis: Secondary | ICD-10-CM | POA: Diagnosis not present

## 2020-07-09 DIAGNOSIS — M25551 Pain in right hip: Secondary | ICD-10-CM | POA: Diagnosis not present

## 2020-07-09 DIAGNOSIS — I10 Essential (primary) hypertension: Secondary | ICD-10-CM | POA: Diagnosis not present

## 2020-07-09 DIAGNOSIS — G8929 Other chronic pain: Secondary | ICD-10-CM | POA: Diagnosis not present

## 2020-07-09 DIAGNOSIS — M7061 Trochanteric bursitis, right hip: Secondary | ICD-10-CM | POA: Diagnosis not present

## 2020-07-09 DIAGNOSIS — E039 Hypothyroidism, unspecified: Secondary | ICD-10-CM | POA: Diagnosis not present

## 2020-08-30 DIAGNOSIS — I10 Essential (primary) hypertension: Secondary | ICD-10-CM | POA: Diagnosis not present

## 2020-08-30 DIAGNOSIS — R0600 Dyspnea, unspecified: Secondary | ICD-10-CM | POA: Diagnosis not present

## 2020-09-06 ENCOUNTER — Other Ambulatory Visit: Payer: Self-pay | Admitting: Cardiology

## 2020-09-06 DIAGNOSIS — R0609 Other forms of dyspnea: Secondary | ICD-10-CM

## 2020-09-06 DIAGNOSIS — R0600 Dyspnea, unspecified: Secondary | ICD-10-CM

## 2020-09-11 ENCOUNTER — Telehealth (HOSPITAL_COMMUNITY): Payer: Self-pay | Admitting: *Deleted

## 2020-09-11 ENCOUNTER — Other Ambulatory Visit (HOSPITAL_COMMUNITY): Payer: Self-pay | Admitting: *Deleted

## 2020-09-11 DIAGNOSIS — I10 Essential (primary) hypertension: Secondary | ICD-10-CM | POA: Diagnosis not present

## 2020-09-11 MED ORDER — METOPROLOL TARTRATE 100 MG PO TABS: ORAL_TABLET | ORAL | 0 refills | Status: DC

## 2020-09-11 NOTE — Telephone Encounter (Signed)
Reaching out to patient to offer assistance regarding upcoming cardiac imaging study; pt verbalizes understanding of appt date/time, parking situation and where to check in, pre-test NPO status and medications ordered, and verified current allergies; name and call back number provided for further questions should they arise  Gordy Clement RN Navigator Cardiac Imaging [facility] [phone] office [phone] cell  Pt to take 100mg  metoprolol tartrate 2 hours prior to test.  Pt to hold daily BP meds until after cardiac CT.

## 2020-09-13 ENCOUNTER — Other Ambulatory Visit: Payer: Self-pay | Admitting: Cardiology

## 2020-09-13 ENCOUNTER — Other Ambulatory Visit: Payer: Self-pay

## 2020-09-13 ENCOUNTER — Ambulatory Visit
Admission: RE | Admit: 2020-09-13 | Discharge: 2020-09-13 | Disposition: A | Discharge status: Home / Self Care | Payer: PPO | Source: Ambulatory Visit | Attending: Cardiology | Admitting: Cardiology

## 2020-09-13 DIAGNOSIS — R911 Solitary pulmonary nodule: Secondary | ICD-10-CM | POA: Diagnosis not present

## 2020-09-13 DIAGNOSIS — R0600 Dyspnea, unspecified: Secondary | ICD-10-CM | POA: Insufficient documentation

## 2020-09-13 DIAGNOSIS — I2510 Atherosclerotic heart disease of native coronary artery without angina pectoris: Secondary | ICD-10-CM | POA: Diagnosis not present

## 2020-09-13 DIAGNOSIS — K449 Diaphragmatic hernia without obstruction or gangrene: Secondary | ICD-10-CM | POA: Diagnosis not present

## 2020-09-13 DIAGNOSIS — I700 Atherosclerosis of aorta: Secondary | ICD-10-CM | POA: Insufficient documentation

## 2020-09-13 DIAGNOSIS — R931 Abnormal findings on diagnostic imaging of heart and coronary circulation: Secondary | ICD-10-CM | POA: Insufficient documentation

## 2020-09-13 DIAGNOSIS — R0609 Other forms of dyspnea: Secondary | ICD-10-CM

## 2020-09-13 MED ORDER — NITROGLYCERIN 0.4 MG SL SUBL
0.8000 mg | SUBLINGUAL_TABLET | Freq: Once | SUBLINGUAL | Status: AC
  Administered 2020-09-13: 0.8 mg via SUBLINGUAL

## 2020-09-13 MED ORDER — METOPROLOL TARTRATE 5 MG/5ML IV SOLN: 10.0000 mg | Freq: Once | INTRAVENOUS | Status: DC

## 2020-09-13 MED ORDER — METOPROLOL TARTRATE 5 MG/5ML IV SOLN
5.0000 mg | Freq: Once | INTRAVENOUS | Status: AC
  Administered 2020-09-13: 5 mg via INTRAVENOUS

## 2020-09-13 MED ORDER — IOHEXOL 350 MG/ML SOLN
90.0000 mL | Freq: Once | INTRAVENOUS | Status: AC | PRN
  Administered 2020-09-13: 90 mL via INTRAVENOUS

## 2020-09-13 NOTE — Progress Notes (Signed)
Patient tolerated CT well. Drank water after. Vital signs stable encourage to drink water throughout day.Reasons explained and verbalized understanding. Ambulated steady gait.  

## 2020-09-14 DIAGNOSIS — I2510 Atherosclerotic heart disease of native coronary artery without angina pectoris: Secondary | ICD-10-CM | POA: Diagnosis not present

## 2020-09-14 DIAGNOSIS — R931 Abnormal findings on diagnostic imaging of heart and coronary circulation: Secondary | ICD-10-CM | POA: Diagnosis not present

## 2020-11-26 DIAGNOSIS — R0789 Other chest pain: Secondary | ICD-10-CM | POA: Diagnosis not present

## 2020-12-25 DIAGNOSIS — X32XXXA Exposure to sunlight, initial encounter: Secondary | ICD-10-CM | POA: Diagnosis not present

## 2020-12-25 DIAGNOSIS — D225 Melanocytic nevi of trunk: Secondary | ICD-10-CM | POA: Diagnosis not present

## 2020-12-25 DIAGNOSIS — D2261 Melanocytic nevi of right upper limb, including shoulder: Secondary | ICD-10-CM | POA: Diagnosis not present

## 2020-12-25 DIAGNOSIS — D2271 Melanocytic nevi of right lower limb, including hip: Secondary | ICD-10-CM | POA: Diagnosis not present

## 2020-12-25 DIAGNOSIS — L570 Actinic keratosis: Secondary | ICD-10-CM | POA: Diagnosis not present

## 2020-12-25 DIAGNOSIS — D2272 Melanocytic nevi of left lower limb, including hip: Secondary | ICD-10-CM | POA: Diagnosis not present

## 2020-12-25 DIAGNOSIS — L821 Other seborrheic keratosis: Secondary | ICD-10-CM | POA: Diagnosis not present

## 2020-12-25 DIAGNOSIS — D2262 Melanocytic nevi of left upper limb, including shoulder: Secondary | ICD-10-CM | POA: Diagnosis not present

## 2021-01-02 DIAGNOSIS — Z0000 Encounter for general adult medical examination without abnormal findings: Secondary | ICD-10-CM | POA: Diagnosis not present

## 2021-01-02 DIAGNOSIS — M7061 Trochanteric bursitis, right hip: Secondary | ICD-10-CM | POA: Diagnosis not present

## 2021-01-02 DIAGNOSIS — I10 Essential (primary) hypertension: Secondary | ICD-10-CM | POA: Diagnosis not present

## 2021-01-02 DIAGNOSIS — G8929 Other chronic pain: Secondary | ICD-10-CM | POA: Diagnosis not present

## 2021-01-02 DIAGNOSIS — K219 Gastro-esophageal reflux disease without esophagitis: Secondary | ICD-10-CM | POA: Diagnosis not present

## 2021-01-02 DIAGNOSIS — M25551 Pain in right hip: Secondary | ICD-10-CM | POA: Diagnosis not present

## 2021-01-02 DIAGNOSIS — E039 Hypothyroidism, unspecified: Secondary | ICD-10-CM | POA: Diagnosis not present

## 2021-01-09 DIAGNOSIS — F5104 Psychophysiologic insomnia: Secondary | ICD-10-CM | POA: Diagnosis not present

## 2021-01-09 DIAGNOSIS — I10 Essential (primary) hypertension: Secondary | ICD-10-CM | POA: Diagnosis not present

## 2021-01-09 DIAGNOSIS — Z23 Encounter for immunization: Secondary | ICD-10-CM | POA: Diagnosis not present

## 2021-01-09 DIAGNOSIS — M7061 Trochanteric bursitis, right hip: Secondary | ICD-10-CM | POA: Diagnosis not present

## 2021-01-09 DIAGNOSIS — E039 Hypothyroidism, unspecified: Secondary | ICD-10-CM | POA: Diagnosis not present

## 2021-01-09 DIAGNOSIS — Z86010 Personal history of colonic polyps: Secondary | ICD-10-CM | POA: Diagnosis not present

## 2021-01-15 ENCOUNTER — Other Ambulatory Visit: Payer: Self-pay | Admitting: Internal Medicine

## 2021-01-15 DIAGNOSIS — Z1231 Encounter for screening mammogram for malignant neoplasm of breast: Secondary | ICD-10-CM

## 2021-01-28 ENCOUNTER — Other Ambulatory Visit: Payer: Self-pay

## 2021-01-28 ENCOUNTER — Ambulatory Visit
Admission: RE | Admit: 2021-01-28 | Discharge: 2021-01-28 | Disposition: A | Discharge status: Home / Self Care | Payer: PPO | Source: Ambulatory Visit | Attending: Internal Medicine | Admitting: Internal Medicine

## 2021-01-28 DIAGNOSIS — Z1231 Encounter for screening mammogram for malignant neoplasm of breast: Secondary | ICD-10-CM | POA: Diagnosis not present

## 2021-05-31 DIAGNOSIS — Z961 Presence of intraocular lens: Secondary | ICD-10-CM | POA: Diagnosis not present

## 2021-07-03 DIAGNOSIS — F5104 Psychophysiologic insomnia: Secondary | ICD-10-CM | POA: Diagnosis not present

## 2021-07-03 DIAGNOSIS — I10 Essential (primary) hypertension: Secondary | ICD-10-CM | POA: Diagnosis not present

## 2021-07-03 DIAGNOSIS — Z86010 Personal history of colonic polyps: Secondary | ICD-10-CM | POA: Diagnosis not present

## 2021-07-03 DIAGNOSIS — E039 Hypothyroidism, unspecified: Secondary | ICD-10-CM | POA: Diagnosis not present

## 2021-07-03 DIAGNOSIS — M7061 Trochanteric bursitis, right hip: Secondary | ICD-10-CM | POA: Diagnosis not present

## 2021-07-10 DIAGNOSIS — M25551 Pain in right hip: Secondary | ICD-10-CM | POA: Diagnosis not present

## 2021-07-10 DIAGNOSIS — E039 Hypothyroidism, unspecified: Secondary | ICD-10-CM | POA: Diagnosis not present

## 2021-07-10 DIAGNOSIS — K219 Gastro-esophageal reflux disease without esophagitis: Secondary | ICD-10-CM | POA: Diagnosis not present

## 2021-07-10 DIAGNOSIS — Z0000 Encounter for general adult medical examination without abnormal findings: Secondary | ICD-10-CM | POA: Diagnosis not present

## 2021-07-10 DIAGNOSIS — I10 Essential (primary) hypertension: Secondary | ICD-10-CM | POA: Diagnosis not present

## 2021-07-10 DIAGNOSIS — F40298 Other specified phobia: Secondary | ICD-10-CM | POA: Diagnosis not present

## 2021-07-10 DIAGNOSIS — G8929 Other chronic pain: Secondary | ICD-10-CM | POA: Diagnosis not present

## 2021-07-10 DIAGNOSIS — Z6832 Body mass index (BMI) 32.0-32.9, adult: Secondary | ICD-10-CM | POA: Diagnosis not present

## 2021-07-22 DIAGNOSIS — M1611 Unilateral primary osteoarthritis, right hip: Secondary | ICD-10-CM | POA: Diagnosis not present

## 2021-07-22 DIAGNOSIS — G8929 Other chronic pain: Secondary | ICD-10-CM | POA: Diagnosis not present

## 2021-07-22 DIAGNOSIS — M25551 Pain in right hip: Secondary | ICD-10-CM | POA: Diagnosis not present

## 2021-08-06 ENCOUNTER — Other Ambulatory Visit: Payer: Self-pay | Admitting: Orthopedic Surgery

## 2021-08-22 ENCOUNTER — Encounter
Admission: RE | Admit: 2021-08-22 | Discharge: 2021-08-22 | Disposition: A | Discharge status: Home / Self Care | Payer: PPO | Source: Ambulatory Visit | Attending: Orthopedic Surgery | Admitting: Orthopedic Surgery

## 2021-08-22 VITALS — BP 147/75 | HR 81 | Resp 17 | Ht 64.0 in

## 2021-08-22 DIAGNOSIS — Z01810 Encounter for preprocedural cardiovascular examination: Secondary | ICD-10-CM | POA: Diagnosis not present

## 2021-08-22 DIAGNOSIS — Z01818 Encounter for other preprocedural examination: Secondary | ICD-10-CM | POA: Insufficient documentation

## 2021-08-22 DIAGNOSIS — Z01812 Encounter for preprocedural laboratory examination: Secondary | ICD-10-CM

## 2021-08-22 HISTORY — DX: Unspecified osteoarthritis, unspecified site: M19.90

## 2021-08-22 HISTORY — DX: Angina pectoris, unspecified: I20.9

## 2021-08-22 LAB — URINALYSIS, ROUTINE W REFLEX MICROSCOPIC
Bilirubin Urine: NEGATIVE
Glucose, UA: NEGATIVE mg/dL
Hgb urine dipstick: NEGATIVE
Ketones, ur: NEGATIVE mg/dL
Leukocytes,Ua: NEGATIVE
Nitrite: NEGATIVE
Protein, ur: NEGATIVE mg/dL
Specific Gravity, Urine: 1.013 (ref 1.005–1.030)
pH: 6 (ref 5.0–8.0)

## 2021-08-22 LAB — SURGICAL PCR SCREEN
MRSA, PCR: NEGATIVE
Staphylococcus aureus: NEGATIVE

## 2021-08-22 NOTE — Pre-Procedure Instructions (Signed)
ECG 12-lead ? ?Component 11 mo ago  ?Vent Rate (bpm)  72   ?PR Interval (msec)  146   ?QRS Interval (msec)  110   ?QT Interval (msec)  426   ?QTc (msec)  466   ?Resulting Agency [facility] GE [facility]USE RESULTS  ?Narrative ?Performed by [place] ?This result has an attachment that is not available.  ?Normal sinus rhythm  ?Left axis deviation  ?RSR' or QR pattern in V1 suggests right ventricular conduction delay  ? ?When compared with ECG of 14-Jul-2019 14:22,  ?RSR' pattern in V1 is now present  ?I reviewed and concur with this report. Electronically signed [id number] MD, KEN ([id number]) on 09/04/2020 8:00:17 AM ?Specimen Collected: 08/30/20 15:37 Last Resulted: 08/30/20 15:37  ?Received From: [facility]  Result Received: 09/25/20 10:17  ? View Encounter  ? ?

## 2021-08-22 NOTE — Patient Instructions (Addendum)
Your procedure is scheduled on: 09/03/21 - Tuesday ?Report to the Registration Desk on the 1st floor of the [facility]. ?To find out your arrival time, please call [phone] between 1PM - 3PM on: 09/02/21 - Monday ? ?REMEMBER: ?Instructions that are not followed completely may result in serious medical risk, up to and including death; or upon the discretion of your surgeon and anesthesiologist your surgery may need to be rescheduled. ? ?Do not eat food after midnight the night before surgery.  ?No gum chewing, lozengers or hard candies. ? ?You may however, drink CLEAR liquids up to 2 hours before you are scheduled to arrive for your surgery. Do not drink anything within 2 hours of your scheduled arrival time. ? ?Clear liquids include: ?- water  ?- apple juice without pulp ?- gatorade (not RED colors) ?- black coffee or tea (Do NOT add milk or creamers to the coffee or tea) ?Do NOT drink anything that is not on this list. ? ?In addition, your doctor has ordered for you to drink the provided  ?Ensure Pre-Surgery Clear Carbohydrate Drink  ?Drinking this carbohydrate drink up to two hours before surgery helps to reduce insulin resistance and improve patient outcomes. Please complete drinking 2 hours prior to scheduled arrival time. ? ?TAKE ONLY THESE MEDICATIONS THE MORNING OF SURGERY WITH A SIP OF WATER: ? ?- amLODipine (NORVASC) 5 MG tablet ?- levothyroxine (SYNTHROID) 125 MCG tablet ?- omeprazole (PRILOSEC) 20 MG capsule, (take one the night before and one on the morning of surgery - helps to prevent nausea after surgery.) ? ?One week prior to surgery: ?Stop Anti-inflammatories (NSAIDS) such as Advil, Aleve, Ibuprofen, Motrin, Naproxen, Naprosyn and Aspirin based products such as Excedrin, Goodys Powder, BC Powder. ? ?Stop ANY OVER THE COUNTER supplements until after surgery. ? ?You may however, continue to take Tylenol if needed for pain up until the day of surgery. ? ?No Alcohol for 24 hours before or after  surgery. ? ?No Smoking including e-cigarettes for 24 hours prior to surgery.  ?No chewable tobacco products for at least 6 hours prior to surgery.  ?No nicotine patches on the day of surgery. ? ?Do not use any "recreational" drugs for at least a week prior to your surgery.  ?Please be advised that the combination of cocaine and anesthesia may have negative outcomes, up to and including death. ?If you test positive for cocaine, your surgery will be cancelled. ? ?On the morning of surgery brush your teeth with toothpaste and water, you may rinse your mouth with mouthwash if you wish. ?Do not swallow any toothpaste or mouthwash. ? ?Use CHG Soap or wipes as directed on instruction sheet. ? ?Do not wear jewelry, make-up, hairpins, clips or nail polish. ? ?Do not wear lotions, powders, or perfumes.  ? ?Do not shave body from the neck down 48 hours prior to surgery just in case you cut yourself which could leave a site for infection.  ?Also, freshly shaved skin may become irritated if using the CHG soap. ? ?Contact lenses, hearing aids and dentures may not be worn into surgery. ? ?Do not bring valuables to the hospital. [facility] is not responsible for any missing/lost belongings or valuables.  ? ?Notify your doctor if there is any change in your medical condition (cold, fever, infection). ? ?Wear comfortable clothing (specific to your surgery type) to the hospital. ? ?After surgery, you can help prevent lung complications by doing breathing exercises.  ?Take deep breaths and cough every 1-2 hours.  Your doctor may order a device called an Incentive Spirometer to help you take deep breaths. ?When coughing or sneezing, hold a pillow firmly against your incision with both hands. This is called ?splinting.? Doing this helps protect your incision. It also decreases belly discomfort. ? ?If you are being admitted to the hospital overnight, leave your suitcase in the car. ?After surgery it may be brought to your room. ? ?If you  are being discharged the day of surgery, you will not be allowed to drive home. ?You will need a responsible adult (18 years or older) to drive you home and stay with you that night.  ? ?If you are taking public transportation, you will need to have a responsible adult (18 years or older) with you. ?Please confirm with your physician that it is acceptable to use public transportation.  ? ?Please call the [place] Dept. at [phone] if you have any questions about these instructions. ? ?Surgery Visitation Policy: ? ?Patients undergoing a surgery or procedure may have two family members or support persons with them as long as the person is not COVID-19 positive or experiencing its symptoms.  ? ?Inpatient Visitation:   ? ?Visiting hours are 7 a.m. to 8 p.m. ?Up to four visitors are allowed at one time in a patient room, including children. The visitors may rotate out with other people during the day. One designated support person (adult) may remain overnight.  ?

## 2021-08-22 NOTE — Pre-Procedure Instructions (Signed)
Progress Notes ?- documented in this encounter ?Brandi Levans, MD - 11/26/2020 3:15 PM EDT ?Formatting of this note is different from the original. ?Images from the original note were not included. ? ? ?Chief Complaint: ?Chief Complaint  ?Patient presents with  ? Hypertension  ?3 month follow up  ?Date of Service: 11/26/2020 ?Date of Birth: Dec 14, 1945 ?PCP: Azzie Glatter, MD  ?History of Present Illness: Brandi Schwartz is a 76 y.o.female patient who has a past medical history significant for hypertension, hypothyroidism, GERD, and family history of coronary artery disease who presents for follow-up visit. She admits to a several month history, first noticeable around Christmas, of exertional shortness of breath as well as exertional fatigue. She reports that doing normal daily activities are significantly more difficult than they had been a few months ago. She also admits to occasional palpitations. She denies chest pain or chest pressure. She denies lower extremity swelling, orthopnea or PND. She denies syncopal/presyncopal episodes. ECG performed at previous visit revealed normal sinus rhythm with lateral ST changes/T wave inversions - no previous ECG available for comparison. She underwent a functional study which revealed no evidence of reversible ischemia. Echo revealed preserved LV function with no structural or valvular abnormalities. She underwent a coronary angiogram which revealed noncritical disease in her coronary tree. Her LDL remains somewhat elevated. We discussed this and she prefers to attempt dietary control rather than a statin. ? ?Past Medical and Surgical History  ?Past Medical History ?Past Medical History:  ?Diagnosis Date  ? Adenomatous colon polyp, unspecified  ? Benign neoplasm of colon  ? Chronic hoarseness 04/18/2016  ? GERD (gastroesophageal reflux disease)  ? History of shingles  ? Hx of adenomatous colonic polyps 04/18/2016  ? Hyperlipidemia  ? Hypertension  ?  Osteoporosis, post-menopausal  ? Thyroid disease  ? ?Past Surgical History ?She has a past surgical history that includes Hysterectomy (1986); Breast excisional biopsy (2005); egd (10/06/2003); Colonoscopy (10/06/2003); Colonoscopy (04/05/2013); Colonoscopy (07/09/2016); egd (07/09/2016); and Colonoscopy (11/29/2018).  ? ?Medications and Allergies  ?Current Medications ? ?Current Outpatient Medications  ?Medication Sig Dispense Refill  ? amLODIPine (NORVASC) 5 MG tablet TAKE 1 TABLET BY MOUTH DAILY 90 tablet 1  ? ergocalciferol, vitamin D2, 1,250 mcg (50,000 unit) capsule TAKE 1 CAPSULE BY MOUTH ONCE WEEKLY AS DIRECTED 13 capsule 1  ? hydroCHLOROthiazide (HYDRODIURIL) 12.5 MG tablet TAKE ONE TABLET EVERY DAY 90 tablet 1  ? levothyroxine (SYNTHROID) 125 MCG tablet Take 1 tablet (125 mcg total) by mouth once daily Take on an empty stomach with a glass of water at least 30-60 minutes before breakfast. 90 tablet 1  ? losartan (COZAAR) 100 MG tablet TAKE 1 TABLET BY MOUTH DAILY 90 tablet 1  ? mirabegron (MYRBETRIQ) 25 mg ER Tablet Take 1 tablet (25 mg total) by mouth once daily 90 tablet 1  ? omeprazole (PRILOSEC) 20 MG DR capsule TAKE 1 CAPSULE TWICE DAILY 180 capsule 1  ? oxybutynin (DITROPAN) 5 mg tablet Take 1 tablet (5 mg total) by mouth 3 (three) times daily for 180 days 270 tablet 1  ? ?No current facility-administered medications for this visit.  ? ?Allergies: Codeine ? ?Social and Family History  ?Social History ?reports that she quit smoking about 17 years ago. She has never used smokeless tobacco. She reports current alcohol use. She reports that she does not use drugs. ? ?Family History ?Family History  ?Problem Relation Age of Onset  ? Throat cancer Mother  ? Lung cancer Father  ? Leukemia Brother  ?  Breast cancer Sister  ? ?Review of Systems  ?Review of Systems  ?Constitutional: Negative for chills, diaphoresis, fever, malaise/fatigue and weight loss.  ?HENT: Negative for congestion, ear discharge, hearing  loss and tinnitus.  ?Eyes: Negative for blurred vision.  ?Respiratory: Negative for cough, hemoptysis, sputum production, shortness of breath and wheezing.  ?Cardiovascular: Negative for chest pain, palpitations, orthopnea, claudication, leg swelling and PND.  ?Gastrointestinal: Negative for abdominal pain, blood in stool, constipation, diarrhea, heartburn, melena, nausea and vomiting.  ?Genitourinary: Negative for dysuria, frequency, hematuria and urgency.  ?Musculoskeletal: Negative for back pain, falls, joint pain and myalgias.  ?Skin: Negative for itching and rash.  ?Neurological: Negative for dizziness, tingling, focal weakness, loss of consciousness, weakness and headaches.  ?Endo/Heme/Allergies: Negative for polydipsia. Does not bruise/bleed easily.  ?Psychiatric/Behavioral: Negative for depression, memory loss and substance abuse. The patient is not nervous/anxious.  ? ?Physical Examination  ? ?Vitals:BP (!) 148/90  Pulse 74  Ht 162.6 cm ('5\' 4"'$ )  Wt 91.2 kg (201 lb)  SpO2 98%  BMI 34.50 kg/m?  ?Ht:162.6 cm ('5\' 4"'$ ) Wt:91.2 kg (201 lb) QVZ:DGLO surface area is 2.03 meters squared. ?Body mass index is 34.5 kg/m?. ? ?Wt Readings from Last 3 Encounters:  ?11/26/20 91.2 kg (201 lb)  ?07/09/20 91.6 kg (202 lb)  ?01/10/20 91.6 kg (202 lb)  ? ?BP Readings from Last 3 Encounters:  ?11/26/20 (!) 148/90  ?07/09/20 (!) 142/84  ?01/10/20 140/80  ? ?General appearance ?appears in no acute distress ? ?Head Mouth and Eye exam ?Normocephalic, without obvious abnormality, atraumatic ?Dentition is good ?Eyes appear anicteric ? ? ?LUNGS ?Breath Sounds: Normal ?Percussion: Normal ? ?CARDIOVASCULAR ?JVP ?CV wave: no ?HJR: no ?Elevation at 90 degrees: None ?Carotid ?Pulse: normal pulsation bilaterally ?Bruit: None ?Apex: apical impulse normal ? ?Auscultation ?Rhythm: normal sinus rhythm ?S1: normal ?S2: normal ?Clicks: no ?Rub: no ?Murmurs: no murmurs  ?Gallop: None ? ?EXTREMITIES ?Clubbing: no ?Edema: trace to 1+ bilateral  pedal edema ?Pulses: peripheral pulses symmetrical ?Femoral Bruits: no ?Amputation: no ?SKIN ?Rash: no ?Cyanosis: no ?Embolic phemonenon: no ?Bruising: no ?NEURO ?Alert and Oriented to person, place and time: yes ?Non focal: yes ? ?PSYCH: Pt appears to have normal affect ? ?LABS REVIEWED ?Last 3 CBC results: ?Lab Results  ?Component Value Date  ?WBC 5.5 07/02/2020  ?WBC 5.7 01/03/2020  ?WBC 5.9 06/29/2019  ? ?Lab Results  ?Component Value Date  ?HGB 14.1 07/02/2020  ?HGB 13.5 01/03/2020  ?HGB 13.9 06/29/2019  ? ?Lab Results  ?Component Value Date  ?HCT 42.6 07/02/2020  ?HCT 42.1 01/03/2020  ?HCT 42.6 06/29/2019  ? ?Lab Results  ?Component Value Date  ?PLT 239 07/02/2020  ?PLT 260 01/03/2020  ?PLT 271 06/29/2019  ? ?Lab Results  ?Component Value Date  ?CREATININE 0.9 09/11/2020  ?BUN 21 09/11/2020  ?NA 134 (L) 09/11/2020  ?K 4.2 09/11/2020  ?CL 97 09/11/2020  ?CO2 32.1 (H) 09/11/2020  ? ?Lab Results  ?Component Value Date  ?HGBA1C 5.5 12/23/2016  ? ?Lab Results  ?Component Value Date  ?HDL 63.6 07/02/2020  ?HDL 65.2 01/03/2020  ?HDL 63.7 06/29/2019  ? ?Lab Results  ?Component Value Date  ?LDLCALC 152 (H) 07/02/2020  ?LDLCALC 152 (H) 01/03/2020  ?[place] 147 (H) 06/29/2019  ? ?Lab Results  ?Component Value Date  ?TRIG 84 07/02/2020  ?TRIG 79 01/03/2020  ?TRIG 76 06/29/2019  ? ?Lab Results  ?Component Value Date  ?ALT 11 07/02/2020  ?AST 13 07/02/2020  ?ALKPHOS 75 07/02/2020  ? ?Lab Results  ?Component Value Date  ?TSH 2.315  07/02/2020  ? ?Diagnostic Studies Reviewed: ? ?EKG ?EKG demonstrated normal sinus rhythm, nonspecific ST and T waves changes. ? ?Assessment and Plan  ? ?76 y.o. female with  ?ICD-10-CM  ?1. Essential hypertension continue with current regimen I10  ?2. DOE (dyspnea on exertion)-does not appear to be secondary to structural valvular disease or ischemia. We will continue with current regimen. Functional studies have been unremarkable but she still has exertional symptoms with risk factors for heart  disease including family history of hypertension. Coronary CT with FFR revealed noncritical disease. R06.00  ?3. Hyperlipidemia-LDL is 152. We discussed consideration for statin. She would prefer to attempt dietary contro

## 2021-08-26 DIAGNOSIS — M1611 Unilateral primary osteoarthritis, right hip: Secondary | ICD-10-CM | POA: Diagnosis not present

## 2021-09-03 ENCOUNTER — Encounter: Payer: Self-pay | Admitting: Orthopedic Surgery

## 2021-09-03 ENCOUNTER — Ambulatory Visit: Payer: PPO | Admitting: Anesthesiology

## 2021-09-03 ENCOUNTER — Other Ambulatory Visit: Payer: Self-pay

## 2021-09-03 ENCOUNTER — Ambulatory Visit: Payer: PPO

## 2021-09-03 ENCOUNTER — Encounter
Admission: RE | Disposition: A | Discharge status: Skilled Nursing Facility | Payer: Self-pay | Source: Home / Self Care | Attending: Orthopedic Surgery

## 2021-09-03 ENCOUNTER — Observation Stay
Admission: RE | Admit: 2021-09-03 | Discharge: 2021-09-04 | Disposition: A | Discharge status: Skilled Nursing Facility | Payer: PPO | Attending: Orthopedic Surgery | Admitting: Orthopedic Surgery

## 2021-09-03 ENCOUNTER — Observation Stay: Payer: PPO

## 2021-09-03 DIAGNOSIS — E039 Hypothyroidism, unspecified: Secondary | ICD-10-CM | POA: Insufficient documentation

## 2021-09-03 DIAGNOSIS — M1611 Unilateral primary osteoarthritis, right hip: Principal | ICD-10-CM | POA: Insufficient documentation

## 2021-09-03 DIAGNOSIS — Z87891 Personal history of nicotine dependence: Secondary | ICD-10-CM | POA: Diagnosis not present

## 2021-09-03 DIAGNOSIS — Z96641 Presence of right artificial hip joint: Secondary | ICD-10-CM | POA: Diagnosis not present

## 2021-09-03 DIAGNOSIS — Z85038 Personal history of other malignant neoplasm of large intestine: Secondary | ICD-10-CM | POA: Insufficient documentation

## 2021-09-03 DIAGNOSIS — Z87898 Personal history of other specified conditions: Secondary | ICD-10-CM | POA: Insufficient documentation

## 2021-09-03 DIAGNOSIS — K219 Gastro-esophageal reflux disease without esophagitis: Secondary | ICD-10-CM | POA: Insufficient documentation

## 2021-09-03 DIAGNOSIS — I10 Essential (primary) hypertension: Secondary | ICD-10-CM | POA: Insufficient documentation

## 2021-09-03 DIAGNOSIS — M810 Age-related osteoporosis without current pathological fracture: Secondary | ICD-10-CM | POA: Insufficient documentation

## 2021-09-03 DIAGNOSIS — Z86010 Personal history of colonic polyps: Secondary | ICD-10-CM | POA: Diagnosis not present

## 2021-09-03 DIAGNOSIS — Z79899 Other long term (current) drug therapy: Secondary | ICD-10-CM | POA: Insufficient documentation

## 2021-09-03 DIAGNOSIS — Z471 Aftercare following joint replacement surgery: Secondary | ICD-10-CM | POA: Diagnosis not present

## 2021-09-03 HISTORY — PX: TOTAL HIP ARTHROPLASTY: SHX124

## 2021-09-03 LAB — CBC
HCT: 40.2 % (ref 36.0–46.0)
Hemoglobin: 13 g/dL (ref 12.0–15.0)
MCH: 31.7 pg (ref 26.0–34.0)
MCHC: 32.3 g/dL (ref 30.0–36.0)
MCV: 98 fL (ref 80.0–100.0)
Platelets: 238 10*3/uL (ref 150–400)
RBC: 4.1 MIL/uL (ref 3.87–5.11)
RDW: 12.9 % (ref 11.5–15.5)
WBC: 11.9 10*3/uL — ABNORMAL HIGH (ref 4.0–10.5)
nRBC: 0 % (ref 0.0–0.2)

## 2021-09-03 LAB — CREATININE, SERUM
Creatinine, Ser: 0.69 mg/dL (ref 0.44–1.00)
GFR, Estimated: >60 mL/min (ref >60)

## 2021-09-03 LAB — ABO/RH: ABO/RH(D): A NEG

## 2021-09-03 SURGERY — ARTHROPLASTY, HIP, TOTAL, ANTERIOR APPROACH
Anesthesia: Spinal | Site: Hip | Laterality: Right

## 2021-09-03 MED ORDER — AMLODIPINE BESYLATE 5 MG PO TABS
5.0000 mg | ORAL_TABLET | Freq: Every day | ORAL | Status: DC
  Administered 2021-09-04: 5 mg via ORAL
  Filled 2021-09-03: qty 1

## 2021-09-03 MED ORDER — ACETAMINOPHEN 325 MG PO TABS: 325.0000 mg | ORAL_TABLET | Freq: Four times a day (QID) | ORAL | Status: DC | PRN

## 2021-09-03 MED ORDER — DEXAMETHASONE SODIUM PHOSPHATE 10 MG/ML IJ SOLN
INTRAMUSCULAR | Status: DC | PRN
  Administered 2021-09-03: 5 mg via INTRAVENOUS

## 2021-09-03 MED ORDER — FLEET ENEMA 7-19 GM/118ML RE ENEM
1.0000 | ENEMA | Freq: Once | RECTAL | Status: DC | PRN
Start: 2021-09-03 — End: 2021-09-04

## 2021-09-03 MED ORDER — ONDANSETRON HCL 4 MG PO TABS
4.0000 mg | ORAL_TABLET | Freq: Four times a day (QID) | ORAL | Status: DC | PRN
  Filled 2021-09-03: qty 1

## 2021-09-03 MED ORDER — CEFAZOLIN SODIUM-DEXTROSE 2-4 GM/100ML-% IV SOLN
INTRAVENOUS | Status: AC
  Administered 2021-09-03: 2 g via INTRAVENOUS
  Filled 2021-09-03: qty 100

## 2021-09-03 MED ORDER — CHLORHEXIDINE GLUCONATE 0.12 % MT SOLN
OROMUCOSAL | Status: AC
  Administered 2021-09-03: 15 mL via OROMUCOSAL
  Filled 2021-09-03: qty 15

## 2021-09-03 MED ORDER — SENNOSIDES-DOCUSATE SODIUM 8.6-50 MG PO TABS: 1.0000 | ORAL_TABLET | Freq: Every evening | ORAL | Status: DC | PRN

## 2021-09-03 MED ORDER — MIDAZOLAM HCL 2 MG/2ML IJ SOLN
INTRAMUSCULAR | Status: AC
Start: 2021-09-03 — End: ?
  Filled 2021-09-03: qty 2

## 2021-09-03 MED ORDER — LACTATED RINGERS IV SOLN: INTRAVENOUS | Status: DC

## 2021-09-03 MED ORDER — PROPOFOL 500 MG/50ML IV EMUL
INTRAVENOUS | Status: DC | PRN
  Administered 2021-09-03: 50 ug/kg/min via INTRAVENOUS

## 2021-09-03 MED ORDER — ZOLPIDEM TARTRATE 5 MG PO TABS
5.0000 mg | ORAL_TABLET | Freq: Every evening | ORAL | Status: DC | PRN
  Administered 2021-09-03: 5 mg via ORAL
  Filled 2021-09-03: qty 1

## 2021-09-03 MED ORDER — BUPIVACAINE HCL (PF) 0.5 % IJ SOLN
INTRAMUSCULAR | Status: DC | PRN
  Administered 2021-09-03: 2.5 mL

## 2021-09-03 MED ORDER — PROPOFOL 10 MG/ML IV BOLUS
INTRAVENOUS | Status: DC | PRN
  Administered 2021-09-03 (×4): 20 mg via INTRAVENOUS

## 2021-09-03 MED ORDER — HYDROCODONE-ACETAMINOPHEN 5-325 MG PO TABS: 1.0000 | ORAL_TABLET | ORAL | Status: DC | PRN

## 2021-09-03 MED ORDER — FENTANYL CITRATE (PF) 100 MCG/2ML IJ SOLN
25.0000 ug | INTRAMUSCULAR | Status: DC | PRN
  Administered 2021-09-03: 50 ug via INTRAVENOUS
  Administered 2021-09-03: 25 ug via INTRAVENOUS

## 2021-09-03 MED ORDER — PHENOL 1.4 % MT LIQD: 1.0000 | OROMUCOSAL | Status: DC | PRN

## 2021-09-03 MED ORDER — DIPHENHYDRAMINE HCL 12.5 MG/5ML PO ELIX: 12.5000 mg | ORAL_SOLUTION | ORAL | Status: DC | PRN

## 2021-09-03 MED ORDER — ORAL CARE MOUTH RINSE: 15.0000 mL | Freq: Once | OROMUCOSAL | Status: AC

## 2021-09-03 MED ORDER — ACETAMINOPHEN 10 MG/ML IV SOLN
INTRAVENOUS | Status: DC | PRN
  Administered 2021-09-03: 1000 mg via INTRAVENOUS

## 2021-09-03 MED ORDER — METHOCARBAMOL 500 MG PO TABS
500.0000 mg | ORAL_TABLET | Freq: Four times a day (QID) | ORAL | Status: DC | PRN
  Administered 2021-09-03 – 2021-09-04 (×2): 500 mg via ORAL
  Filled 2021-09-03 (×2): qty 1

## 2021-09-03 MED ORDER — FLUTICASONE PROPIONATE 50 MCG/ACT NA SUSP: 1.0000 | Freq: Every day | NASAL | Status: DC | PRN

## 2021-09-03 MED ORDER — BUPIVACAINE HCL (PF) 0.5 % IJ SOLN
INTRAMUSCULAR | Status: AC
Start: 2021-09-03 — End: ?
  Filled 2021-09-03: qty 10

## 2021-09-03 MED ORDER — FENTANYL CITRATE (PF) 100 MCG/2ML IJ SOLN
INTRAMUSCULAR | Status: AC
  Filled 2021-09-03: qty 2

## 2021-09-03 MED ORDER — HYDROCODONE-ACETAMINOPHEN 7.5-325 MG PO TABS
1.0000 | ORAL_TABLET | ORAL | Status: DC | PRN
  Administered 2021-09-03: 1 via ORAL
  Filled 2021-09-03 (×2): qty 2

## 2021-09-03 MED ORDER — FENTANYL CITRATE (PF) 100 MCG/2ML IJ SOLN: 25.0000 ug | INTRAMUSCULAR | Status: DC | PRN

## 2021-09-03 MED ORDER — APREPITANT 40 MG PO CAPS
40.0000 mg | ORAL_CAPSULE | Freq: Once | ORAL | Status: AC
Start: 2021-09-03 — End: 2021-09-03

## 2021-09-03 MED ORDER — METOCLOPRAMIDE HCL 5 MG PO TABS
5.0000 mg | ORAL_TABLET | Freq: Three times a day (TID) | ORAL | Status: DC | PRN
  Filled 2021-09-03: qty 2

## 2021-09-03 MED ORDER — PHENYLEPHRINE HCL-NACL 20-0.9 MG/250ML-% IV SOLN
INTRAVENOUS | Status: DC | PRN
  Administered 2021-09-03: 35 ug/min via INTRAVENOUS

## 2021-09-03 MED ORDER — LEVOTHYROXINE SODIUM 125 MCG PO TABS
125.0000 ug | ORAL_TABLET | Freq: Every day | ORAL | Status: DC
  Administered 2021-09-04: 125 ug via ORAL
  Filled 2021-09-03: qty 1

## 2021-09-03 MED ORDER — APREPITANT 40 MG PO CAPS
ORAL_CAPSULE | ORAL | Status: AC
Start: 2021-09-03 — End: 2021-09-03
  Administered 2021-09-03: 40 mg via ORAL
  Filled 2021-09-03: qty 1

## 2021-09-03 MED ORDER — TRAMADOL HCL 50 MG PO TABS
50.0000 mg | ORAL_TABLET | Freq: Four times a day (QID) | ORAL | Status: DC
  Administered 2021-09-03 – 2021-09-04 (×4): 50 mg via ORAL
  Filled 2021-09-03 (×5): qty 1

## 2021-09-03 MED ORDER — METOCLOPRAMIDE HCL 5 MG/ML IJ SOLN: 5.0000 mg | Freq: Three times a day (TID) | INTRAMUSCULAR | Status: DC | PRN

## 2021-09-03 MED ORDER — ONDANSETRON HCL 4 MG/2ML IJ SOLN
INTRAMUSCULAR | Status: DC | PRN
  Administered 2021-09-03: 4 mg via INTRAVENOUS

## 2021-09-03 MED ORDER — SODIUM CHLORIDE (PF) 0.9 % IJ SOLN
INTRAMUSCULAR | Status: DC | PRN
  Administered 2021-09-03: 90 mL via INTRAMUSCULAR

## 2021-09-03 MED ORDER — DEXAMETHASONE SODIUM PHOSPHATE 10 MG/ML IJ SOLN
INTRAMUSCULAR | Status: AC
  Filled 2021-09-03: qty 1

## 2021-09-03 MED ORDER — EPHEDRINE SULFATE (PRESSORS) 50 MG/ML IJ SOLN
INTRAMUSCULAR | Status: DC | PRN
  Administered 2021-09-03: 10 mg via INTRAVENOUS
  Administered 2021-09-03: 5 mg via INTRAVENOUS

## 2021-09-03 MED ORDER — DOCUSATE SODIUM 100 MG PO CAPS
100.0000 mg | ORAL_CAPSULE | Freq: Two times a day (BID) | ORAL | Status: DC
  Administered 2021-09-04: 100 mg via ORAL
  Filled 2021-09-03 (×2): qty 1

## 2021-09-03 MED ORDER — ACETAMINOPHEN 10 MG/ML IV SOLN
INTRAVENOUS | Status: AC
  Filled 2021-09-03: qty 100

## 2021-09-03 MED ORDER — CHLORHEXIDINE GLUCONATE 0.12 % MT SOLN: 15.0000 mL | Freq: Once | OROMUCOSAL | Status: AC

## 2021-09-03 MED ORDER — MORPHINE SULFATE (PF) 4 MG/ML IV SOLN: 0.5000 mg | INTRAVENOUS | Status: DC | PRN

## 2021-09-03 MED ORDER — SURGIPHOR WOUND IRRIGATION SYSTEM - OPTIME: TOPICAL | Status: DC | PRN

## 2021-09-03 MED ORDER — ONDANSETRON HCL 4 MG/2ML IJ SOLN: 4.0000 mg | Freq: Four times a day (QID) | INTRAMUSCULAR | Status: DC | PRN

## 2021-09-03 MED ORDER — EPHEDRINE 5 MG/ML INJ
INTRAVENOUS | Status: AC
  Filled 2021-09-03: qty 5

## 2021-09-03 MED ORDER — MIDAZOLAM HCL 5 MG/5ML IJ SOLN
INTRAMUSCULAR | Status: DC | PRN
  Administered 2021-09-03 (×2): 1 mg via INTRAVENOUS

## 2021-09-03 MED ORDER — BISACODYL 5 MG PO TBEC: 5.0000 mg | DELAYED_RELEASE_TABLET | Freq: Every day | ORAL | Status: DC | PRN

## 2021-09-03 MED ORDER — PROPOFOL 1000 MG/100ML IV EMUL
INTRAVENOUS | Status: AC
  Filled 2021-09-03: qty 100

## 2021-09-03 MED ORDER — 0.9 % SODIUM CHLORIDE (POUR BTL) OPTIME
TOPICAL | Status: DC | PRN
  Administered 2021-09-03: 1000 mL

## 2021-09-03 MED ORDER — CEFAZOLIN SODIUM-DEXTROSE 2-4 GM/100ML-% IV SOLN
2.0000 g | Freq: Four times a day (QID) | INTRAVENOUS | Status: AC
  Administered 2021-09-03: 2 g via INTRAVENOUS
  Filled 2021-09-03 (×4): qty 100

## 2021-09-03 MED ORDER — METHOCARBAMOL 1000 MG/10ML IJ SOLN
500.0000 mg | Freq: Four times a day (QID) | INTRAVENOUS | Status: DC | PRN
  Filled 2021-09-03: qty 5

## 2021-09-03 MED ORDER — LOSARTAN POTASSIUM 50 MG PO TABS
100.0000 mg | ORAL_TABLET | Freq: Every day | ORAL | Status: DC
  Administered 2021-09-04: 100 mg via ORAL
  Filled 2021-09-03: qty 2

## 2021-09-03 MED ORDER — MAGNESIUM HYDROXIDE 400 MG/5ML PO SUSP
30.0000 mL | Freq: Every day | ORAL | Status: DC
  Filled 2021-09-03: qty 30

## 2021-09-03 MED ORDER — HYDROCHLOROTHIAZIDE 12.5 MG PO TABS
12.5000 mg | ORAL_TABLET | Freq: Every day | ORAL | Status: DC
  Administered 2021-09-04: 12.5 mg via ORAL
  Filled 2021-09-03: qty 1

## 2021-09-03 MED ORDER — FENTANYL CITRATE (PF) 100 MCG/2ML IJ SOLN
INTRAMUSCULAR | Status: DC | PRN
  Administered 2021-09-03 (×2): 50 ug via INTRAVENOUS

## 2021-09-03 MED ORDER — ONDANSETRON HCL 4 MG/2ML IJ SOLN
INTRAMUSCULAR | Status: AC
  Filled 2021-09-03: qty 2

## 2021-09-03 MED ORDER — CEFAZOLIN SODIUM-DEXTROSE 2-4 GM/100ML-% IV SOLN
2.0000 g | INTRAVENOUS | Status: AC
  Administered 2021-09-03: 2 g via INTRAVENOUS

## 2021-09-03 MED ORDER — HEMOSTATIC AGENTS (NO CHARGE) OPTIME
TOPICAL | Status: DC | PRN
  Administered 2021-09-03: 2 via TOPICAL

## 2021-09-03 MED ORDER — MENTHOL 3 MG MT LOZG: 1.0000 | LOZENGE | OROMUCOSAL | Status: DC | PRN

## 2021-09-03 MED ORDER — HYDROCODONE-ACETAMINOPHEN 7.5-325 MG PO TABS
ORAL_TABLET | ORAL | Status: AC
  Filled 2021-09-03: qty 1

## 2021-09-03 MED ORDER — ALUM & MAG HYDROXIDE-SIMETH 200-200-20 MG/5ML PO SUSP: 30.0000 mL | ORAL | Status: DC | PRN

## 2021-09-03 MED ORDER — ENOXAPARIN SODIUM 40 MG/0.4ML IJ SOSY
40.0000 mg | PREFILLED_SYRINGE | INTRAMUSCULAR | Status: DC
  Administered 2021-09-04: 40 mg via SUBCUTANEOUS
  Filled 2021-09-03: qty 0.4

## 2021-09-03 MED ORDER — SODIUM CHLORIDE 0.9 % IV SOLN: INTRAVENOUS | Status: DC

## 2021-09-03 MED ORDER — PANTOPRAZOLE SODIUM 40 MG PO TBEC
40.0000 mg | DELAYED_RELEASE_TABLET | Freq: Every day | ORAL | Status: DC
  Administered 2021-09-04: 40 mg via ORAL
  Filled 2021-09-03: qty 1

## 2021-09-03 SURGICAL SUPPLY — 60 items
BLADE SAGITTAL AGGR TOOTH XLG (BLADE) ×2 IMPLANT
BNDG COHESIVE 6X5 TAN ST LF (GAUZE/BANDAGES/DRESSINGS) ×4 IMPLANT
BOWL CEMENT MIXING ADV NOZZLE (MISCELLANEOUS) ×1 IMPLANT
CANISTER WOUND CARE 500ML ATS (WOUND CARE) ×2 IMPLANT
CEMENT BONE 40GM (Cement) ×2 IMPLANT
CEMENT RESTRICTOR DEPUY SZ 3 (Cement) ×1 IMPLANT
CHLORAPREP W/TINT 26 (MISCELLANEOUS) ×2 IMPLANT
COVER BACK TABLE REUSABLE LG (DRAPES) ×2 IMPLANT
DRAPE 3/4 80X56 (DRAPES) ×5 IMPLANT
DRAPE C-ARM XRAY 36X54 (DRAPES) ×2 IMPLANT
DRAPE INCISE IOBAN 66X60 STRL (DRAPES) IMPLANT
DRAPE POUCH INSTRU U-SHP 10X18 (DRAPES) ×2 IMPLANT
DRESSING SURGICEL FIBRLLR 1X2 (HEMOSTASIS) ×2 IMPLANT
DRSG MEPILEX SACRM 8.7X9.8 (GAUZE/BANDAGES/DRESSINGS) ×2 IMPLANT
DRSG SURGICEL FIBRILLAR 1X2 (HEMOSTASIS) ×4
ELECT BLADE 6.5 EXT (BLADE) ×2 IMPLANT
ELECT REM PT RETURN 9FT ADLT (ELECTROSURGICAL) ×2
ELECTRODE REM PT RTRN 9FT ADLT (ELECTROSURGICAL) ×1 IMPLANT
GLOVE SURG SYN 9.0  PF PI (GLOVE) ×2
GLOVE SURG SYN 9.0 PF PI (GLOVE) ×2 IMPLANT
GLOVE SURG UNDER POLY LF SZ9 (GLOVE) ×2 IMPLANT
GOWN SRG 2XL LVL 4 RGLN SLV (GOWNS) ×1 IMPLANT
GOWN STRL NON-REIN 2XL LVL4 (GOWNS) ×1
GOWN STRL REUS W/ TWL LRG LVL3 (GOWN DISPOSABLE) ×1 IMPLANT
GOWN STRL REUS W/TWL LRG LVL3 (GOWN DISPOSABLE) ×1
HIP FEM HD M 28 (Head) ×1 IMPLANT
HIP STEM FEM 2 STD (Stem) ×1 IMPLANT
HOLDER FOLEY CATH W/STRAP (MISCELLANEOUS) ×2 IMPLANT
HOOD PEEL AWAY FLYTE STAYCOOL (MISCELLANEOUS) ×2 IMPLANT
KIT PREVENA INCISION MGT 13 (CANNISTER) ×2 IMPLANT
KNIFE SCULPS 14X20 (INSTRUMENTS) ×1 IMPLANT
LINER DUAL MOB 50MM (Liner) ×1 IMPLANT
MANIFOLD NEPTUNE II (INSTRUMENTS) ×2 IMPLANT
MAT ABSORB  FLUID 56X50 GRAY (MISCELLANEOUS) ×1
MAT ABSORB FLUID 56X50 GRAY (MISCELLANEOUS) ×1 IMPLANT
NDL SPNL 20GX3.5 QUINCKE YW (NEEDLE) ×2 IMPLANT
NEEDLE SPNL 20GX3.5 QUINCKE YW (NEEDLE) ×4 IMPLANT
NOZZLE FLEX THIN (MISCELLANEOUS) ×1 IMPLANT
NS IRRIG 1000ML POUR BTL (IV SOLUTION) ×2 IMPLANT
PACK HIP COMPR (MISCELLANEOUS) ×2 IMPLANT
PRESSURIZER CEMENT PROX FEM SM (MISCELLANEOUS) ×1 IMPLANT
SCALPEL PROTECTED #10 DISP (BLADE) ×4 IMPLANT
SHELL ACETABULAR SZ0 50 DME (Shell) ×1 IMPLANT
SOLUTION IRRIG SURGIPHOR (IV SOLUTION) ×1 IMPLANT
STAPLER SKIN PROX 35W (STAPLE) ×2 IMPLANT
STRAP SAFETY 5IN WIDE (MISCELLANEOUS) ×1 IMPLANT
SUT DVC 2 QUILL PDO  T11 36X36 (SUTURE) ×1
SUT DVC 2 QUILL PDO T11 36X36 (SUTURE) ×1 IMPLANT
SUT SILK 0 (SUTURE) ×1
SUT SILK 0 30XBRD TIE 6 (SUTURE) ×1 IMPLANT
SUT V-LOC 90 ABS DVC 3-0 CL (SUTURE) ×2 IMPLANT
SUT VIC AB 1 CT1 36 (SUTURE) ×2 IMPLANT
SYR 20ML LL LF (SYRINGE) ×1 IMPLANT
SYR 30ML LL (SYRINGE) ×2 IMPLANT
SYR 50ML LL SCALE MARK (SYRINGE) ×4 IMPLANT
SYR BULB IRRIG 60ML STRL (SYRINGE) ×2 IMPLANT
TAPE MICROFOAM 4IN (TAPE) ×1 IMPLANT
TOWEL OR 17X26 4PK STRL BLUE (TOWEL DISPOSABLE) IMPLANT
TRAY FOLEY MTR SLVR 16FR STAT (SET/KITS/TRAYS/PACK) ×2 IMPLANT
WATER STERILE IRR 1000ML POUR (IV SOLUTION) ×1 IMPLANT

## 2021-09-03 NOTE — NC FL2 (Signed)
?[place] MEDICAID FL2 LEVEL OF CARE SCREENING TOOL  ?  ? ?IDENTIFICATION  ?Patient Name: ?Brandi Schwartz Birthdate: [birth year] Sex: female Admission Date (Current Location): ?09/03/2021  ?[place] and [place] Number: ?  ?  Facility and Address:  ?[facility], [address] [place], [place] [address] ?     Provider Number: ?[id number]  ?Attending Physician Name and Address:  ?Hessie Knows, MD ? Relative Name and Phone Number:  ?  ?   ?Current Level of Care: ?Hospital Recommended Level of Care: ?[place] Prior Approval Number: ?  ? ?Date Approved/Denied: ?  PASRR Number: ?[id number] A ? ?Discharge Plan: ?SNF ?  ? ?Current Diagnoses: ?Patient Active Problem List  ? Diagnosis Date Noted  ? 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 ? ?Orientation RESPIRATION BLADDER Height & Weight   ?  ?Self, Time, Situation, Place ? Normal Continent Weight: 190 lb (86.2 kg) ?Height:  '5\' 4"'$  (162.6 cm)  ?BEHAVIORAL SYMPTOMS/MOOD NEUROLOGICAL BOWEL NUTRITION STATUS  ?    Continent Diet (regular diet, thin liquids)  ?AMBULATORY STATUS COMMUNICATION OF NEEDS Skin   ?Limited Assist Verbally Surgical wounds, Wound Vac (closed incision rigth hip) ?  ?  ?  ?    ?     ?     ? ? ?Personal Care Assistance Level of Assistance  ?Bathing, Feeding, Dressing Bathing Assistance: Limited assistance ?Feeding assistance: Independent ?Dressing Assistance: Limited assistance ?   ? ?Functional Limitations Info  ?Sight, Speech, Hearing Sight Info: Adequate ?Hearing Info: Adequate ?Speech Info: Adequate  ? ? ?SPECIAL CARE FACTORS FREQUENCY  ?PT (By licensed PT), OT (By licensed OT)   ?  ?PT Frequency: 5x ?OT Frequency: 5x ?  ?  ?  ?   ? ? ?Contractures Contractures Info: Not present  ? ? ?Additional Factors Info  ?Code Status, Allergies Code Status Info: full code ?Allergies Info: codeine ?  ?  ?  ?   ? ?Current Medications (09/03/2021):  This is the current hospital active medication list ?Current  Facility-Administered Medications  ?Medication Dose Route Frequency Provider Last Rate Last Admin  ? 0.9 %  sodium chloride infusion   Intravenous Continuous Hessie Knows, MD      ? Derrill Memo ON 09/04/2021] acetaminophen (TYLENOL) tablet 325-650 mg  325-650 mg Oral Q6H PRN Hessie Knows, MD      ? alum & mag hydroxide-simeth (MAALOX/MYLANTA) 200-200-20 MG/5ML suspension 30 mL  30 mL Oral Q4H PRN Hessie Knows, MD      ? amLODipine (NORVASC) tablet 5 mg  5 mg Oral Daily Hessie Knows, MD      ? bisacodyl (DULCOLAX) EC tablet 5 mg  5 mg Oral Daily PRN Hessie Knows, MD      ? ceFAZolin (ANCEF) IVPB 2g/100 mL premix  2 g Intravenous Q6H Hessie Knows, MD 200 mL/hr at 09/03/21 1518 2 g at 09/03/21 1518  ? diphenhydrAMINE (BENADRYL) 12.5 MG/5ML elixir 12.5-25 mg  12.5-25 mg Oral Q4H PRN Hessie Knows, MD      ? docusate sodium (COLACE) capsule 100 mg  100 mg Oral BID Hessie Knows, MD      ? Derrill Memo ON 09/04/2021] enoxaparin (LOVENOX) injection 40 mg  40 mg Subcutaneous Q24H Hessie Knows, MD      ? fentaNYL (SUBLIMAZE) 100 MCG/2ML injection           ? fluticasone (FLONASE) 50 MCG/ACT nasal spray 1 spray  1 spray Each Nare Daily PRN Hessie Knows, MD      ?  hydrochlorothiazide (HYDRODIURIL) tablet 12.5 mg  12.5 mg Oral Daily Hessie Knows, MD      ? HYDROcodone-acetaminophen [facility]) 7.5-325 MG per tablet 1-2 tablet  1-2 tablet Oral Q4H PRN Hessie Knows, MD   1 tablet at 09/03/21 1320  ? HYDROcodone-acetaminophen (NORCO) 7.5-325 MG per tablet           ? HYDROcodone-acetaminophen (NORCO/VICODIN) 5-325 MG per tablet 1-2 tablet  1-2 tablet Oral Q4H PRN Hessie Knows, MD      ? Derrill Memo ON 09/04/2021] levothyroxine (SYNTHROID) tablet 125 mcg  125 mcg Oral Q0600 Hessie Knows, MD      ? losartan (COZAAR) tablet 100 mg  100 mg Oral Daily Hessie Knows, MD      ? magnesium hydroxide (MILK OF MAGNESIA) suspension 30 mL  30 mL Oral QHS Hessie Knows, MD      ? menthol-cetylpyridinium (CEPACOL) lozenge 3 mg  1 lozenge Oral PRN Hessie Knows, MD      ? Or  ? phenol (CHLORASEPTIC) mouth spray 1 spray  1 spray Mouth/Throat PRN Hessie Knows, MD      ? methocarbamol (ROBAXIN) tablet 500 mg  500 mg Oral Q6H PRN Hessie Knows, MD      ? Or  ? methocarbamol (ROBAXIN) 500 mg in dextrose 5 % 50 mL IVPB  500 mg Intravenous Q6H PRN Hessie Knows, MD      ? metoCLOPramide (REGLAN) tablet 5-10 mg  5-10 mg Oral Q8H PRN Hessie Knows, MD      ? Or  ? metoCLOPramide (REGLAN) injection 5-10 mg  5-10 mg Intravenous Q8H PRN Hessie Knows, MD      ? morphine (PF) 4 MG/ML injection 0.52-1 mg  0.52-1 mg Intravenous Q2H PRN Hessie Knows, MD      ? ondansetron [facility]) tablet 4 mg  4 mg Oral Q6H PRN Hessie Knows, MD      ? Or  ? ondansetron (ZOFRAN) injection 4 mg  4 mg Intravenous Q6H PRN Hessie Knows, MD      ? pantoprazole (PROTONIX) EC tablet 40 mg  40 mg Oral Daily Hessie Knows, MD      ? senna-docusate (Senokot-S) tablet 1 tablet  1 tablet Oral QHS PRN Hessie Knows, MD      ? sodium phosphate (FLEET) 7-19 GM/118ML enema 1 enema  1 enema Rectal Once PRN Hessie Knows, MD      ? traMADol Veatrice [facility]bon) tablet 50 mg  50 mg Oral Q6H Hessie Knows, MD      ? zolpidem (AMBIEN) tablet 5 mg  5 mg Oral QHS PRN Hessie Knows, MD      ? ? ? ?Discharge Medications: ?Please see discharge summary for a list of discharge medications. ? ?Relevant Imaging Results: ? ?Relevant Lab Results: ? ? ?Additional Information ?[id number] ? ?Tanna Loeffler A Elisabetta Mishra, LCSW ? ? ? ? ?

## 2021-09-03 NOTE — H&P (Signed)
Chief Complaint  ?Patient presents with  ? Pre-op Exam  ?Right THA scheduled 09/03/21 with Dr. Rudene Christians  ? ? ?History of the Present Illness: ?Brandi Schwartz is a 76 y.o. female here today who presents for history and physical for right total hip arthroplasty with Dr. Hessie Knows on 09/03/2021. X-rays from March 2023 show advanced right hip osteoarthritis with complete loss of joint space and severe degenerative changes and spurring throughout the entire joint. Patient's pain has been severe over the last 6 months. She describes pain in her groin, lateral hip. She has a hard time walking from her car to the grocery store, she barely makes it to the carts but is able to walk inside the store with a cart. She denies any back pain numbness tingling or radicular symptoms. She is tried some over-the-counter medications with little relief. Pain is interfering with her quality of life and activities day living. ? ?The patient has no history of blood clots. Her cardiologist is Dr. Ubaldo Glassing. ? ?The patient lives alone. She lives within walking distance of the hospital. Her son is a Manufacturing engineer, and she could not live with him. Her daughter's bedrooms are all upstairs. ? ?I have reviewed past medical, surgical, social and family history, and allergies as documented in the EMR. ? ?Past Medical History: ?Past Medical History:  ?Diagnosis Date  ? Adenomatous colon polyp  ? Benign neoplasm of colon  ? Chronic hoarseness 04/18/2016  ? GERD (gastroesophageal reflux disease)  ? History of shingles  ? Hx of adenomatous colonic polyps 04/18/2016  ? Hyperlipidemia  ? Hypertension  ? Osteoporosis, post-menopausal  ? Thyroid disease  ? ?Past Surgical History: ?Past Surgical History:  ?Procedure Laterality Date  ? HYSTERECTOMY 1986  ? BREAST EXCISIONAL BIOPSY 2005  ? EGD 10/06/2003  ? COLONOSCOPY 10/06/2003  ?Hyperplastic Polyps  ? COLONOSCOPY 04/05/2013  ?04/07/2008, 07/03/2006; Adenomatous Polyps: [facility] 04/2016; Recall Ltr mailed 02/07/2016 (dw)  ?  COLONOSCOPY 07/09/2016  ?Sessile Serrated Adenoma: [facility] 07/2018 Recall ltr mailed  ? EGD 07/09/2016  ?Gastritis: No repeat per RTE  ? COLONOSCOPY 11/29/2018  ?Tubular adenoma of the colon/Repeat 64yr/TKT  ? ?Past Family History: ?Family History  ?Problem Relation Age of Onset  ? Throat cancer Mother  ? Lung cancer Father  ? Leukemia Brother  ? Breast cancer Sister  ? ?Medications: ?Current Outpatient Medications Ordered in Epic  ?Medication Sig Dispense Refill  ? amLODIPine (NORVASC) 5 MG tablet TAKE 1 TABLET BY MOUTH DAILY 90 tablet 1  ? hydroCHLOROthiazide (HYDRODIURIL) 12.5 MG tablet TAKE 1 TABLET BY MOUTH DAILY 90 tablet 1  ? hydrocortisone 2.5 % cream Apply topically 2 (two) times daily Use on affected area x 10 days 30 g 2  ? levothyroxine (SYNTHROID) 125 MCG tablet TAKE 1 TABLET EVERY DAY ON EMPTY STOMACHWITH A GLASS OF WATER AT LEAST 30-60 MINBEFORE BREAKFAST 90 tablet 1  ? losartan (COZAAR) 100 MG tablet TAKE 1 TABLET BY MOUTH DAILY 90 tablet 1  ? omeprazole (PRILOSEC) 20 MG DR capsule TAKE 1 CAPSULE BY MOUTH TWICE DAILY 180 capsule 1  ? VITAMIN D2 1,250 mcg (50,000 unit) capsule TAKE 1 CAPSULE BY MOUTH ONCE WEEKLY AS DIRECTED 13 capsule 1  ? ?No current Epic-ordered facility-administered medications on file.  ? ?Allergies: ?Allergies  ?Allergen Reactions  ? Codeine Abdominal Pain  ? ? ?Body mass index is 32.89 kg/m?. ? ?Review of Systems: ?A comprehensive 14 point ROS was performed, reviewed, and the pertinent orthopaedic findings are documented in the HPI. ? ?Vitals:  ?  08/26/21 1551  ?BP: (!) 140/78  ? ? ?General Physical Examination:  ?General:  ?Well developed, well nourished, no apparent distress, normal affect, antalgic gait with no assistive devices. ? ?HEENT: ?Head normocephalic, atraumatic, PERRL.  ? ?Abdomen: ?Soft, non tender, non distended, Bowel sounds present. ? ?Heart: ?Examination of the heart reveals regular, rate, and rhythm. There is no murmur noted on ascultation. There is a normal apical  pulse. ? ?Lungs: ?Lungs are clear to auscultation. There is no wheeze, rhonchi, or crackles. There is normal expansion of bilateral chest walls.  ? ?Right hip: ?On exam, right hip internal rotation is 10 degrees, external is 20 degrees with pain. No swelling or edema throughout the right lower extremity. No warmth or redness. Pain with right hip internal rotation along the groin and lateral hip. ? ?Radiographs: ?AP pelvis and lateral x-rays of the right hip were reviewed by me today from March 2023. These show complete loss of central joint space, large osteophytes around the femoral head and neck, cyst formation in the head. On the lateral view, there are very large osteophytes on the inferior neck and acetabulum consistent with advanced severe central hip osteoarthritis. Left hip is relatively normal. 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 Comparing this to x-ray from 1 year ago, there has been some significant progress in joint space loss. ? ?Assessment: ?ICD-10-CM  ?1. Primary osteoarthritis of right hip M16.11  ? ?Plan: ?82. 76 year old female with advanced right hip osteoarthritis. Pain severe and interfering with quality of life and activities daily living. Risks, benefits, complications of a right total hip arthroplasty have been discussed with the patient. Patient has agreed and consented to the procedure with Dr. Hessie Knows on 09/03/2021 ? ?Surgical Risks: ? ?The nature of the condition and the proposed procedure has been reviewed in detail with the patient. Surgical versus non-surgical options and prognosis for recovery have been reviewed and the inherent risks and benefits of each have been discussed including the risks of infection, bleeding, injury to nerves/blood vessels/tendons, incomplete relief of symptoms, persisting pain and/or stiffness, loss of function, complex regional pain syndrome, failure of the procedure, as appropriate. ? ? ?Electronically signed by Feliberto Gottron, Edgar at 08/26/2021 3:57  PM EDT ? ?Reviewed  H+P. ?No changes noted. ? ? ?

## 2021-09-03 NOTE — Plan of Care (Signed)
Pt admitted to floor. No signs or symptoms of distress. Pt and daughter oriented to unit and room. Will continue to monitor. ?

## 2021-09-03 NOTE — TOC Initial Note (Signed)
Transition of Care (TOC) - Initial/Assessment Note  ? ? ?Patient Details  ?Name: Brandi Schwartz ?MRN: [id number] ?Date of Birth: 1945-10-06 ? ?Transition of Care (TOC) CM/SW Contact:    ?Emon Miggins A Shanah Guimaraes, LCSW ?Phone Number: ?09/03/2021, 3:45 PM ? ?Clinical Narrative:      CSW spoke with pt and pt's daughter at bedside. HH was arranged with [facility] prior to surgery. However, when speaking with pt at bedside she is thinking she might need SNF. Pt states [facility] to [facility] month [facility] and states they put pt on wait list. PT to work with pt tomorrow to better understanding physical abilities. CSW explained [facility] does not often have open beds- pt understanding. Pt states if [facility] can not take her she will push to go home. Pt will need a walker and bsc if she dc home. CSW to follow up with PT regarding rec tomorrow. Referral also sent to [facility] per pt permission.    ? ? ?Expected Discharge Plan:  (undetermined) ?Barriers to Discharge: Continued Medical Work up ? ? ?Patient Goals and CMS Choice ?Patient states their goals for this hospitalization and ongoing recovery are:: to get better ?  ?Choice offered to / list presented to : Patient ? ?Expected Discharge Plan and Services ?Expected Discharge Plan:  (undetermined) ?In-house Referral: Clinical Social Work ?  ?Post Acute Care Choice:  (undetermined) ?Living arrangements for the past 2 months: [place] ?                ?  ?  ?  ?  ?  ?  ?  ?  ?  ?  ? ?Prior Living Arrangements/Services ?Living arrangements for the past 2 months: [place] ?Lives with:: Self ?Patient language and need for interpreter reviewed:: Yes ?Do you feel safe going back to the place where you live?: Yes      ?Need for Family Participation in Patient Care: Yes (Comment) ?Care giver support system in place?: Yes (comment) ?  ?Criminal Activity/Legal Involvement Pertinent to Current Situation/Hospitalization: No - Comment as  needed ? ?Activities of Daily Living ?  ?  ? ?Permission Sought/Granted ?Permission sought to share information with : Family Supports ?Permission granted to share information with : Yes, Verbal Permission Granted ? Share Information with NAME: Erasmo Downer ? Permission granted to share info w AGENCY: [facility] ? Permission granted to share info w Relationship: daughter ?   ? ?Emotional Assessment ?Appearance:: Appears stated age ?Attitude/Demeanor/Rapport: Engaged ?Affect (typically observed): Accepting ?Orientation: : Oriented to  Time, Oriented to Place, Oriented to Situation, Oriented to Self ?Alcohol / Substance Use: Not Applicable ?Psych Involvement: No (comment) ? ?Admission diagnosis:  Status post total hip replacement, right [Z96.641] ?Patient Active Problem List  ? Diagnosis Date Noted  ? 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 ?PCP:  Tracie Harrier, MD ?Pharmacy:   ?[facility] - [place], [place] - [address] ?[address] ?[place] [address] ?Phone: [phone] Fax: [phone] ? ? ? ? ?Social Determinants of Health (SDOH) Interventions ?  ? ?Readmission Risk Interventions ?   ? View : No data to display.  ?  ?  ?  ? ? ? ?

## 2021-09-03 NOTE — Anesthesia Preprocedure Evaluation (Signed)
Anesthesia Evaluation  ?Patient identified by MRN, date of birth, ID band ?Patient awake ? ? ? ?Reviewed: ?Allergy & Precautions, H&P , NPO status , Patient's Chart, lab work & pertinent test results ? ?History of Anesthesia Complications ?(+) PONV and history of anesthetic complications ? ?Airway ?Mallampati: II ? ?TM Distance: >3 FB ?Neck ROM: full ? ? ? Dental ? ?(+) Chipped, Upper Dentures, Dental Advidsory Given ?  ?Pulmonary ?neg shortness of breath, neg recent URI, former smoker,  ?  ? ? ? ? ? ? ? Cardiovascular ?Exercise Tolerance: Good ?hypertension, (-) angina(-) Past MI and (-) DOE (-) dysrhythmias  ? ? ?  ?Neuro/Psych ?negative neurological ROS ? negative psych ROS  ? GI/Hepatic ?Neg liver ROS, GERD  Medicated and Controlled,  ?Endo/Other  ?Hypothyroidism  ? Renal/GU ?negative Renal ROS  ?negative genitourinary ?  ?Musculoskeletal ? ? Abdominal ?  ?Peds ? Hematology ?negative hematology ROS ?(+)   ?Anesthesia Other Findings ?Past Medical History: ?No date: GERD (gastroesophageal reflux disease) ?No date: History of colonic polyps ?No date: History of shingles ?No date: Hoarseness, chronic ?No date: Hypertension ?No date: Hypothyroidism ?No date: Osteoporosis ?No date: PONV (postoperative nausea and vomiting) ?    Comment:  many yrs ago.  None recently ?No date: Wears dentures ?    Comment:  full upper ? ?Past Surgical History: ?No date: ABDOMINAL HYSTERECTOMY ?2005: BREAST EXCISIONAL BIOPSY; Left ?    Comment:  NEG ?05/27/2017: CATARACT EXTRACTION W/PHACO; Left ?    Comment:  Procedure: CATARACT EXTRACTION PHACO AND INTRAOCULAR  ?             LENS PLACEMENT (IOC) LEFT;  Surgeon: Wallace Going,  ?             Nila Nephew, MD;  Location: [place];  Service:  ?             Ophthalmology;  Laterality: Left; ?06/24/2017: CATARACT EXTRACTION W/PHACO; Right ?    Comment:  Procedure: CATARACT EXTRACTION PHACO AND INTRAOCULAR  ?             LENS PLACEMENT (IOC) RIGHT;   Surgeon: Wallace Going,  ?             Nila Nephew, MD;  Location: [place];  Service:  ?             Ophthalmology;  Laterality: Right;  cannot arrive before  ?             830AM ?No date: COLONOSCOPY ?07/09/2016: COLONOSCOPY WITH PROPOFOL; N/A ?    Comment:  Procedure: COLONOSCOPY WITH PROPOFOL;  Surgeon: Herbie Baltimore T ?             Vira Agar, MD;  Location: [place];  Service:  ?             Endoscopy;  Laterality: N/A; ?No date: ESOPHAGOGASTRODUODENOSCOPY ?07/09/2016: ESOPHAGOGASTRODUODENOSCOPY; N/A ?    Comment:  Procedure: ESOPHAGOGASTRODUODENOSCOPY (EGD);  Surgeon:  ?             Manya Silvas, MD;  Location: [facility] ENDOSCOPY;   ?             Service: Endoscopy;  Laterality: N/A; ? ?BMI   ? Body Mass Index: 31.14 kg/m?  ?  ? ? Reproductive/Obstetrics ?negative OB ROS ? ?  ? ? ? ? ? ? ? ? ? ? ? ? ? ?  ?  ? ? ? ? ? ? ? ? ?Anesthesia Physical ? ?Anesthesia Plan ? ?ASA: 3 ? ?  Anesthesia Plan: General  ? ?Post-op Pain Management:   ? ?Induction: Intravenous ? ?PONV Risk Score and Plan: Propofol infusion and TIVA ? ?Airway Management Planned: Natural Airway and Nasal Cannula ? ?Additional Equipment:  ? ?Intra-op Plan:  ? ?Post-operative Plan:  ? ?Informed Consent: I have reviewed the patients History and Physical, chart, labs and discussed the procedure including the risks, benefits and alternatives for the proposed anesthesia with the patient or authorized representative who has indicated his/her understanding and acceptance.  ? ? ? ?Dental Advisory Given ? ?Plan Discussed with: Anesthesiologist, CRNA and Surgeon ? ?Anesthesia Plan Comments: (Patient consented for risks of anesthesia including but not limited to:  ?- adverse reactions to medications ?- risk of intubation if required ?- damage to teeth, lips or other oral mucosa ?- sore throat or hoarseness ?- Damage to heart, brain, lungs or loss of life ? ?Patient voiced understanding.)  ? ? ? ? ? ? ?Anesthesia Quick Evaluation ? ?

## 2021-09-03 NOTE — Anesthesia Procedure Notes (Signed)
Date/Time: 09/03/2021 9:38 AM ?Performed by: Demetrius Charity, CRNA ?Pre-anesthesia Checklist: Patient identified, Emergency Drugs available, Suction available, Patient being monitored and Timeout performed ?Patient Re-evaluated:Patient Re-evaluated prior to induction ?Oxygen Delivery Method: Simple face mask ?Induction Type: IV induction ?Placement Confirmation: CO2 detector and positive ETCO2 ? ? ? ? ?

## 2021-09-03 NOTE — Transfer of Care (Signed)
Immediate Anesthesia Transfer of Care Note ? ?Patient: Brandi Schwartz ? ?Procedure(s) Performed: TOTAL HIP ARTHROPLASTY ANTERIOR APPROACH (Right: Hip) ? ?Patient Location: PACU ? ?Anesthesia Type:Spinal ? ?Level of Consciousness: awake and alert  ? ?Airway & Oxygen Therapy: Patient Spontanous Breathing and Patient connected to face mask oxygen ? ?Post-op Assessment: Report given to RN and Post -op Vital signs reviewed and stable ? ?Post vital signs: Reviewed and stable ? ?Last Vitals:  ?Vitals Value Taken Time  ?BP 100/49 09/03/21 1047  ?Temp    ?Pulse 92 09/03/21 1049  ?Resp 23 09/03/21 1049  ?SpO2 98 % 09/03/21 1049  ?Vitals shown include unvalidated device data. ? ?Last Pain:  ?Vitals:  ? 09/03/21 0821  ?TempSrc: Temporal  ?PainSc: 0-No pain  ?   ? ?  ? ?Complications: No notable events documented. ?

## 2021-09-03 NOTE — Evaluation (Signed)
Physical Therapy Evaluation ?Patient Details ?Name: Brandi Schwartz ?MRN: [id number] ?DOB: 1946-04-05 ?Today's Date: 09/03/2021 ? ?History of Present Illness ? admitted s/p R THA, anterior approach, WBAT (09/03/21)  ?Clinical Impression ?  Patient resting in bed upon arrival to room; daughter at bedside.  Patient alert and oriented to basic information; follows simple commands, and eager for OOB activities.  Endorses post-op pain/soreness to R hip, FACES 3-4/10; meds received prior to session. Demonstrates fair post-op strength (3-/5) and ROM to R hip, limited by generalized pain and soreness.  Currently requiring min assist for bed mobility; min assist for sit/stand and basic transfers with RW.  Fair/good R LE WBing and stability with standing and transfer; additional distance limited by pain.  Do anticipate consistent progress towards all mobility goals; patient very motivated to participate and progress. ?Would benefit from skilled PT to address above deficits and promote optimal return to PLOF; recommend continued therapy at discharge as recommended by physician (and pending additional mobility assessment). ?   ? ?Recommendations for follow up therapy are one component of a multi-disciplinary discharge planning process, led by the attending physician.  Recommendations may be updated based on patient status, additional functional criteria and insurance authorization. ? ?Follow Up Recommendations Follow physician's recommendations for discharge plan and follow up therapies ? ?  ?Assistance Recommended at Discharge Intermittent Supervision/Assistance  ?Patient can return home with the following ? A little help with walking and/or transfers;A little help with bathing/dressing/bathroom ? ?  ?Equipment Recommendations Rolling walker (2 wheels);BSC/3in1  ?Recommendations for Other Services ?    ?  ?Functional Status Assessment Patient has had a recent decline in their functional status and demonstrates the ability to make  significant improvements in function in a reasonable and predictable amount of time.  ? ?  ?Precautions / Restrictions Precautions ?Precautions: Fall;Anterior Hip ?Restrictions ?Weight Bearing Restrictions: Yes ?RLE Weight Bearing: Weight bearing as tolerated  ? ?  ? ?Mobility ? Bed Mobility ?Overal bed mobility: Needs Assistance ?Bed Mobility: Supine to Sit ?  ?  ?Supine to sit: Min guard ?  ?  ?  ?  ? ?Transfers ?Overall transfer level: Needs assistance ?Equipment used: Rolling walker (2 wheels) ?Transfers: Sit to/from Stand, Bed to chair/wheelchair/BSC ?Sit to Stand: Min assist ?Stand pivot transfers: Min assist ?  ?  ?  ?  ?General transfer comment: 3-point gait pattern, mod WBing bilat UEs.  Fair/good WBing R LE ?  ? ?Ambulation/Gait ?  ?  ?  ?  ?  ?  ?  ?General Gait Details: deferred due to pain ? ?Stairs ?  ?  ?  ?  ?  ? ?Wheelchair Mobility ?  ? ?Modified Rankin (Stroke Patients Only) ?  ? ?  ? ?Balance Overall balance assessment: Needs assistance ?Sitting-balance support: Feet supported, No upper extremity supported ?Sitting balance-Leahy Scale: Good ?  ?  ?Standing balance support: Bilateral upper extremity supported ?Standing balance-Leahy Scale: Fair ?  ?  ?  ?  ?  ?  ?  ?  ?  ?  ?  ?  ?   ? ? ? ?Pertinent Vitals/Pain Pain Assessment ?Pain Assessment: Faces ?Faces Pain Scale: Hurts little more ?Pain Location: R hip ?Pain Descriptors / Indicators: Aching, Guarding ?Pain Intervention(s): Limited activity within patient's tolerance, Monitored during session, Repositioned, Premedicated before session, Ice applied  ? ? ?Home Living   ?  ?  ?  ?  ?  ?  ?  ?  ?  ?Additional Comments: At baseline,  patient lives alone in single-story home, 3 steps to enter at back (bilat rails, too wide), 3 steps to enter in garage (no rails).  Interested in transition to [place] prior to return home  ?  ?Prior Function Prior Level of Function : Independent/Modified Independent;Driving ?  ?  ?  ?  ?  ?  ?Mobility Comments: Indep  with ADLs, household and community mobilization without assist device; denies fall history. ?  ?  ? ? ?Hand Dominance  ?   ? ?  ?Extremity/Trunk Assessment  ? Upper Extremity Assessment ?Upper Extremity Assessment: Overall WFL for tasks assessed ?  ? ?Lower Extremity Assessment ?Lower Extremity Assessment:  (R hip grossly 3-/5, limited by post-op pain/soreness) ?  ? ?   ?Communication  ? Communication: No difficulties  ?Cognition Arousal/Alertness: Awake/alert ?Behavior During Therapy: [facility] for tasks assessed/performed ?Overall Cognitive Status: Within Functional Limits for tasks assessed ?  ?  ?  ?  ?  ?  ?  ?  ?  ?  ?  ?  ?  ?  ?  ?  ?  ?  ?  ? ?  ?General Comments   ? ?  ?Exercises Other Exercises ?Other Exercises: Supine LE therex, 1x10, active assist ROM: ankle pumps, quad sets, SAQs, heel slides, hip abduct/adduct.  Fair/good isolated strength ?Other Exercises: Reviewed role of PT and progressive mobility, R LE WBing, safety with tranfsers/gait (including use of RW); patient voiced understanding of all information.  ? ?Assessment/Plan  ?  ?PT Assessment Patient needs continued PT services  ?PT Problem List Decreased strength;Decreased range of motion;Decreased activity tolerance;Decreased balance;Decreased knowledge of use of DME;Decreased knowledge of precautions;Decreased safety awareness;Decreased mobility;Pain ? ?   ?  ?PT Treatment Interventions DME instruction;Gait training;Stair training;Functional mobility training;Therapeutic activities;Therapeutic exercise;Balance training;Patient/family education   ? ?PT Goals (Current goals can be found in the Care Plan section)  ?Acute Rehab PT Goals ?Patient Stated Goal: to go to rehab for 3-4 days before I go back home ?PT Goal Formulation: With patient ?Time For Goal Achievement: 09/17/21 ?Potential to Achieve Goals: Good ? ?  ?Frequency BID ?  ? ? ?Co-evaluation   ?  ?  ?  ?  ? ? ?  ?AM-PAC PT "6 Clicks" Mobility  ?Outcome Measure Help needed turning from your  back to your side while in a flat bed without using bedrails?: A Little ?Help needed moving from lying on your back to sitting on the side of a flat bed without using bedrails?: A Little ?Help needed moving to and from a bed to a chair (including a wheelchair)?: A Little ?Help needed standing up from a chair using your arms (e.g., wheelchair or bedside chair)?: A Little ?Help needed to walk in hospital room?: A Little ?Help needed climbing 3-5 steps with a railing? : A Lot ?6 Click Score: 17 ? ?  ?End of Session Equipment Utilized During Treatment: Gait belt ?Activity Tolerance: Patient tolerated treatment well ?Patient left: in chair;with call bell/phone within reach;with bed alarm set ?Nurse Communication: Mobility status ?PT Visit Diagnosis: Muscle weakness (generalized) (M62.81);Difficulty in walking, not elsewhere classified (R26.2);Pain ?Pain - Right/Left: Right ?Pain - part of body: Hip ?  ? ?Time: 0300-9233 ?PT Time Calculation (min) (ACUTE ONLY): 27 min ? ? ?Charges:   PT Evaluation ?$PT Eval Moderate Complexity: 1 Mod ?PT Treatments ?$Therapeutic Exercise: 8-22 mins ?  ?   ? ? ?Jerardo Costabile H. Owens Shark, PT, DPT, NCS ?09/03/21, 10:56 PM ?[phone] ? ? ?

## 2021-09-03 NOTE — Progress Notes (Signed)
Notified patient family that she will be going to room 160 on the first floor, patient is doing excellent.  ?

## 2021-09-03 NOTE — Anesthesia Procedure Notes (Signed)
Spinal ? ?Patient location during procedure: OR ?Start time: 09/03/2021 9:12 AM ?End time: 09/03/2021 9:15 AM ?Reason for block: surgical anesthesia ?Staffing ?Performed: resident/CRNA  ?Resident/CRNA: Demetrius Charity, CRNA ?Preanesthetic Checklist ?Completed: patient identified, IV checked, site marked, risks and benefits discussed, surgical consent, monitors and equipment checked, pre-op evaluation and timeout performed ?Spinal Block ?Patient position: sitting ?Prep: Betadine ?Patient monitoring: heart rate, continuous pulse ox, blood pressure and cardiac monitor ?Approach: midline ?Location: L4-5 ?Injection technique: single-shot ?Needle ?Needle type: Whitacre and Introducer  ?Needle gauge: 24 G ?Needle length: 9 cm ?Assessment ?Events: CSF return ?Additional Notes ?Negative paresthesia. Negative blood return. Positive free-flowing CSF. Expiration date of kit checked and confirmed. Patient tolerated procedure well, without complications. ? ? ? ? ? ?

## 2021-09-03 NOTE — Op Note (Signed)
09/03/2021 ? ?10:51 AM ? ?PATIENT:  Brandi Schwartz  76 y.o. female ? 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 ?POST-OPERATIVE DIAGNOSIS:  Primary localized osteoarthritis of right hip  M16.11 ? ?PROCEDURE:  Procedure(s): ?TOTAL HIP ARTHROPLASTY ANTERIOR APPROACH (Right) ? ?SURGEON: Laurene Footman, MD ? ?ASSISTANTS: None ? ?ANESTHESIA:   spinal ? ?EBL:  Total I/O ?In: -  ?Out: 350 [Urine:200; Blood:150] ? ?BLOOD ADMINISTERED:none ? ?DRAINS:  Incisional wound VAC   ? ?LOCAL MEDICATIONS USED:  MARCAINE    and OTHER Exparel ? ?SPECIMEN:  Source of Specimen:    Right femoral head ? ?DISPOSITION OF SPECIMEN:  PATHOLOGY ? ?COUNTS:  YES ? ?TOURNIQUET:  * No tourniquets in log * ? ?IMPLANTS: Medacta cemented standard 2 stem, 50 mm mpact DM cup and liner with metal M 28 mm head ? ?DICTATION: .Dragon Dictation   The patient was brought to the operating room and after spinal anesthesia was obtained patient was placed on the operative table with the ipsilateral foot into the Medacta attachment, contralateral leg on a well-padded table. C-arm was brought in and preop template x-ray taken. After prepping and draping in usual sterile fashion appropriate patient identification and timeout procedures were completed. Anterior approach to the hip was obtained and centered over the greater trochanter and TFL muscle. The subcutaneous tissue was incised hemostasis being achieved by electrocautery. TFL fascia was incised and the muscle retracted laterally deep retractor placed. The lateral femoral circumflex vessels were identified and ligated. The anterior capsule was exposed and a capsulotomy performed. The neck was identified and a femoral neck cut carried out with a saw. The head was removed without difficulty and showed sclerotic femoral head and acetabulum. Reaming was carried out to 50 mm and a 50 mm cup trial gave appropriate tightness to the acetabular component a 50 DM cup was impacted  into position. The leg was then externally rotated and ischiofemoral and pubofemoral releases carried out. The femur was sequentially broached to a size 3.  The canal was prepared 3 DePuy cement restrictor placed.  Cement pressurized in the canal and a size 2 stem inserted to the appropriate depth and excess cement removed with a sponge having been placed in the acetabulum to protect the metal. size S head trials were placed and the final components chosen. The metal M 28 mm head and 50 mm liner. The hip was reduced and was stable the wound was thoroughly irrigated with fibrillar placed along the posterior capsule and medial neck. The deep fascia ws closed using a heavy Quill after infiltration of 30 cc of quarter percent Sensorcaine with epinephrine diluted with Exparel .3-0 V-loc to close the skin with skin staples.  Incisional wound VAC applied and patient was sent to recovery in stable condition.  ? ?PLAN OF CARE: Admit for overnight observation ? ?

## 2021-09-04 ENCOUNTER — Encounter: Payer: Self-pay | Admitting: Orthopedic Surgery

## 2021-09-04 DIAGNOSIS — R531 Weakness: Secondary | ICD-10-CM | POA: Diagnosis not present

## 2021-09-04 DIAGNOSIS — Z471 Aftercare following joint replacement surgery: Secondary | ICD-10-CM | POA: Diagnosis not present

## 2021-09-04 DIAGNOSIS — R2681 Unsteadiness on feet: Secondary | ICD-10-CM | POA: Diagnosis not present

## 2021-09-04 DIAGNOSIS — G479 Sleep disorder, unspecified: Secondary | ICD-10-CM | POA: Diagnosis not present

## 2021-09-04 DIAGNOSIS — Z7401 Bed confinement status: Secondary | ICD-10-CM | POA: Diagnosis not present

## 2021-09-04 DIAGNOSIS — M6281 Muscle weakness (generalized): Secondary | ICD-10-CM | POA: Diagnosis not present

## 2021-09-04 DIAGNOSIS — M1611 Unilateral primary osteoarthritis, right hip: Secondary | ICD-10-CM | POA: Diagnosis not present

## 2021-09-04 DIAGNOSIS — E039 Hypothyroidism, unspecified: Secondary | ICD-10-CM | POA: Diagnosis not present

## 2021-09-04 DIAGNOSIS — Z96641 Presence of right artificial hip joint: Secondary | ICD-10-CM | POA: Diagnosis not present

## 2021-09-04 DIAGNOSIS — K219 Gastro-esophageal reflux disease without esophagitis: Secondary | ICD-10-CM | POA: Diagnosis not present

## 2021-09-04 DIAGNOSIS — Z96649 Presence of unspecified artificial hip joint: Secondary | ICD-10-CM | POA: Diagnosis not present

## 2021-09-04 DIAGNOSIS — M810 Age-related osteoporosis without current pathological fracture: Secondary | ICD-10-CM | POA: Diagnosis not present

## 2021-09-04 DIAGNOSIS — I10 Essential (primary) hypertension: Secondary | ICD-10-CM | POA: Diagnosis not present

## 2021-09-04 LAB — BASIC METABOLIC PANEL
Anion gap: 6 (ref 5–15)
BUN: 16 mg/dL (ref 8–23)
CO2: 27 mmol/L (ref 22–32)
Calcium: 8.8 mg/dL — ABNORMAL LOW (ref 8.9–10.3)
Chloride: 102 mmol/L (ref 98–111)
Creatinine, Ser: 0.72 mg/dL (ref 0.44–1.00)
GFR, Estimated: >60 mL/min (ref >60)
Glucose, Bld: 136 mg/dL — ABNORMAL HIGH (ref 70–99)
Potassium: 3.9 mmol/L (ref 3.5–5.1)
Sodium: 135 mmol/L (ref 135–145)

## 2021-09-04 LAB — CBC
HCT: 35.3 % — ABNORMAL LOW (ref 36.0–46.0)
Hemoglobin: 11.8 g/dL — ABNORMAL LOW (ref 12.0–15.0)
MCH: 33.1 pg (ref 26.0–34.0)
MCHC: 33.4 g/dL (ref 30.0–36.0)
MCV: 98.9 fL (ref 80.0–100.0)
Platelets: 213 10*3/uL (ref 150–400)
RBC: 3.57 MIL/uL — ABNORMAL LOW (ref 3.87–5.11)
RDW: 12.9 % (ref 11.5–15.5)
WBC: 9.3 10*3/uL (ref 4.0–10.5)
nRBC: 0 % (ref 0.0–0.2)

## 2021-09-04 MED ORDER — DOCUSATE SODIUM 100 MG PO CAPS: 100.0000 mg | ORAL_CAPSULE | Freq: Two times a day (BID) | ORAL | 0 refills | Status: AC

## 2021-09-04 MED ORDER — HYDROCODONE-ACETAMINOPHEN 5-325 MG PO TABS: 1.0000 | ORAL_TABLET | ORAL | 0 refills | Status: AC | PRN

## 2021-09-04 MED ORDER — TRAMADOL HCL 50 MG PO TABS: 50.0000 mg | ORAL_TABLET | Freq: Four times a day (QID) | ORAL | 0 refills | Status: AC

## 2021-09-04 MED ORDER — ENOXAPARIN SODIUM 40 MG/0.4ML IJ SOSY: 40.0000 mg | PREFILLED_SYRINGE | INTRAMUSCULAR | 0 refills | Status: AC

## 2021-09-04 NOTE — Evaluation (Addendum)
Occupational Therapy Evaluation ?Patient Details ?Name: Brandi Schwartz ?MRN: [id number] ?DOB: [birth year] ?Today's Date: 09/04/2021 ? ? ?History of Present Illness admitted s/p R THA, anterior approach, WBAT (09/03/21)  ? ?Clinical Impression ?  ?Pt seen for OT evaluation this date, POD#1 from above surgery. Pt was independent in all ADLs prior to surgery. Pt lives alone with 3 STE, family can intermittently assist however both adult children work. Pt is eager to return to PLOF with less pain and improved safety and independence. Pt currently requires MAX A for LB dressing while in seated position due to pain. Pt instructed in precautions, self care skills, falls prevention strategies, home/routines modifications, DME/AE for LB bathing and dressing tasks, compression stocking mgt strategies. Bed mobility completed with supervision with HOB raised, STS with RW with CGA with frequent vcs, grooming tasks standing at sink level with RW with supervision. Amb within room around bed to chair with supervision with RW.  Pt would benefit from additional instruction in self care skills and techniques to help maintain precautions with or without assistive devices to support recall and carryover prior to discharge. Recommend STR following discharge at this time. If pt discharge home recommend HHOT, BSC, shower chair. OT will continue to follow acutely.  ?   ? ?Recommendations for follow up therapy are one component of a multi-disciplinary discharge planning process, led by the attending physician.  Recommendations may be updated based on patient status, additional functional criteria and insurance authorization.  ? ?Follow Up Recommendations ? Follow physician's recommendations for discharge plan and follow up therapies  ?  ?Assistance Recommended at Discharge Intermittent Supervision/Assistance  ?Patient can return home with the following A little help with walking and/or transfers;A little help with bathing/dressing/bathroom ? ?   ?Functional Status Assessment ? Patient has had a recent decline in their functional status and demonstrates the ability to make significant improvements in function in a reasonable and predictable amount of time.  ?Equipment Recommendations ? BSC/3in1;Tub/shower bench;Other (comment) (hip kit PRN pt will purchase if required)  ?  ?Recommendations for Other Services   ? ? ?  ?Precautions / Restrictions Precautions ?Precautions: Fall;Anterior Hip ?Restrictions ?Weight Bearing Restrictions: Yes ?RLE Weight Bearing: Weight bearing as tolerated  ? ?  ? ?Mobility Bed Mobility ?Overal bed mobility: Needs Assistance ?Bed Mobility: Supine to Sit ?  ?  ?Supine to sit: Supervision, HOB elevated ?  ?  ?  ?  ? ?Transfers ?Overall transfer level: Needs assistance ?Equipment used: Rolling walker (2 wheels) ?Transfers: Sit to/from Stand ?Sit to Stand: Min guard (multiple attempts, vcs for technique) ?  ?  ?  ?  ?  ?  ?  ? ?  ?Balance Overall balance assessment: Needs assistance ?Sitting-balance support: Feet supported, No upper extremity supported ?Sitting balance-Leahy Scale: Good ?  ?  ?Standing balance support: Bilateral upper extremity supported, Reliant on assistive device for balance, During functional activity ?Standing balance-Leahy Scale: Fair ?  ?  ?  ?  ?  ?  ?  ?  ?  ?  ?  ?  ?   ? ?ADL either performed or assessed with clinical judgement  ? ?ADL Overall ADL's : Needs assistance/impaired ?Eating/Feeding: Sitting;Set up ?  ?Grooming: Wash/dry hands;Wash/dry face;Oral care;Standing;Set up ?Grooming Details (indicate cue type and reason): sink level with RW ?Upper Body Bathing: Sitting;Set up ?  ?  ?  ?Upper Body Dressing : Sitting;Set up ?  ?Lower Body Dressing: Maximal assistance ?Lower Body Dressing Details (indicate cue type and reason):  socks edu re: LB AE use ?Toilet Transfer: Supervision/safety;Min guard;Ambulation ?Toilet Transfer Details (indicate cue type and reason): simulated to bedside chair ?  ?  ?  ?   ?Functional mobility during ADLs: Supervision/safety;Min guard;Rolling walker (2 wheels) (approx 15' in room around bed to chair) ?   ? ? ? ?Vision Patient Visual Report: No change from baseline ?   ?   ?Perception   ?  ?Praxis   ?  ? ?Pertinent Vitals/Pain Pain Assessment ?Pain Assessment: 0-10 ?Pain Score: 4  ?Pain Location: R hip ?Pain Descriptors / Indicators: Aching, Guarding ?Pain Intervention(s): Limited activity within patient's tolerance, Monitored during session, Premedicated before session, Repositioned  ? ? ? ?Hand Dominance   ?  ?Extremity/Trunk Assessment Upper Extremity Assessment ?Upper Extremity Assessment: Overall WFL for tasks assessed ?  ?Lower Extremity Assessment ?Lower Extremity Assessment: RLE deficits/detail ?RLE Deficits / Details: R hip THA ?  ?  ?  ?Communication Communication ?Communication: No difficulties ?  ?Cognition Arousal/Alertness: Awake/alert ?Behavior During Therapy: [facility] for tasks assessed/performed ?Overall Cognitive Status: Within Functional Limits for tasks assessed ?  ?  ?  ?  ?  ?  ?  ?  ?  ?  ?  ?  ?  ?  ?  ?  ?  ?  ?  ?General Comments    ? ?  ?Exercises   ?  ?Shoulder Instructions    ? ? ?Home Living Family/patient expects to be discharged to:: Skilled nursing facility ?Living Arrangements: Alone ?Available Help at Discharge: Family;Available PRN/intermittently ?Type of Home: House ?Home Access: Stairs to enter ?Entrance Stairs-Number of Steps: 3 ?Entrance Stairs-Rails: Right;Left ?Home Layout: One level ?  ?  ?Bathroom Shower/Tub: Tub/shower unit;Walk-in shower ?  ?Bathroom Toilet: Handicapped height ?  ?  ?Home Equipment: Grab bars - tub/shower ?  ?  ?  ? ?  ?Prior Functioning/Environment Prior Level of Function : Independent/Modified Independent;Driving ?  ?  ?  ?  ?  ?  ?Mobility Comments: ind no ADhome and community distances ?ADLs Comments: ind no AD for ADL/IADL ?  ? ?  ?  ?OT Problem List: Decreased activity tolerance;Impaired balance (sitting and/or  standing) ?  ?   ?OT Treatment/Interventions: Self-care/ADL training;Patient/family education;Therapeutic activities;DME and/or AE instruction  ?  ?OT Goals(Current goals can be found in the care plan section) Acute Rehab OT Goals ?Patient Stated Goal: go to rehab ?OT Goal Formulation: With patient ?Time For Goal Achievement: 09/18/21 ?Potential to Achieve Goals: Good ?ADL Goals ?Pt Will Perform Grooming: with modified independence;standing ?Pt Will Perform Upper Body Dressing: with modified independence;sitting;standing ?Pt Will Perform Lower Body Dressing: with modified independence;with adaptive equipment;sitting/lateral leans;sit to/from stand ?Pt Will Transfer to Toilet: with modified independence;ambulating;bedside commode ?Pt Will Perform Toileting - Clothing Manipulation and hygiene: with modified independence  ?OT Frequency: Min 2X/week ?  ? ?Co-evaluation   ?  ?  ?  ?  ? ?  ?AM-PAC OT "6 Clicks" Daily Activity     ?Outcome Measure Help from another person eating meals?: None ?Help from another person taking care of personal grooming?: None ?Help from another person toileting, which includes using toliet, bedpan, or urinal?: A Little ?Help from another person bathing (including washing, rinsing, drying)?: A Lot ?Help from another person to put on and taking off regular upper body clothing?: A Little ?Help from another person to put on and taking off regular lower body clothing?: A Lot ?6 Click Score: 18 ?  ?End of Session Equipment Utilized During Treatment:  Rolling walker (2 wheels) ?Nurse Communication: Mobility status ? ?Activity Tolerance: Patient tolerated treatment well ?Patient left: in chair;with call bell/phone within reach;with chair alarm set ? ?OT Visit Diagnosis: Unsteadiness on feet (R26.81)  ?              ?Time: 7628-3151 ?OT Time Calculation (min): 31 min ?Charges:  OT General Charges ?$OT Visit: 1 Visit ?OT Evaluation ?$OT Eval Low Complexity: 1 Low ?OT Treatments ?$Self Care/Home  Management : 8-22 mins ? ?Shanon Payor, OTD OTR/L  ?09/04/21, 10:19 AM  ?

## 2021-09-04 NOTE — Discharge Summary (Signed)
?Physician Discharge Summary  ?Patient ID: ?Brandi Schwartz ?MRN: [id number] ?DOB/AGE: 12/12/1945 76 y.o. ? ?Admit date: 09/03/2021 ?Discharge date: 09/04/2021 ? ?Admission Diagnoses:  ?Status post total hip replacement, right [Z96.641] ? ? ?Discharge Diagnoses: ?Patient Active Problem List  ? Diagnosis Date Noted  ? 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 ? ?Past Medical History:  ?Diagnosis Date  ? Anginal pain ([place])   ? neg stress test, neg ECHO tes  ? Arthritis   ? GERD (gastroesophageal reflux disease)   ? History of colonic polyps   ? History of shingles   ? Hoarseness, chronic   ? Hypertension   ? Hypothyroidism   ? Osteoporosis   ? PONV (postoperative nausea and vomiting)   ? many yrs ago.  None recently  ? Wears dentures   ? full upper  ? ?  ?Transfusion: none ?  ?Consultants (if any):  ? ?Discharged Condition: Improved ? ?Hospital Course: Brandi Schwartz is an 76 y.o. female who was admitted 09/03/2021 with a diagnosis of Status post total hip replacement, right and went to the operating room on 09/03/2021 and underwent the above named procedures.  ?  ?Surgeries: Procedure(s): ?TOTAL HIP ARTHROPLASTY ANTERIOR APPROACH on 09/03/2021 ?Patient tolerated the surgery well. Taken to PACU where she was stabilized and then transferred to the orthopedic floor. ? ?Started on Lovenox 40 mg q 24 hrs. Foot pumps applied bilaterally at 80 mm. Heels elevated on bed with rolled towels. No evidence of DVT. Negative Homan. ?Physical therapy started on day #1 for gait training and transfer. OT started day #1 for ADL and assisted devices. ? ?Patient's foley was d/c on day #1. Patient's IV was d/c on day #1. ? ?On post op day #1 patient was stable and ready for discharge to SNF. ? ? ? ?She was given perioperative antibiotics:  ?Anti-infectives (From admission, onward)  ? ? Start     Dose/Rate Route Frequency Ordered Stop  ? 09/03/21 1500  ceFAZolin (ANCEF) IVPB 2g/100 mL premix       ? 2 g ?200 mL/hr over 30 Minutes  Intravenous Every 6 hours 09/03/21 1357 09/03/21 2146  ? 09/03/21 0809  ceFAZolin (ANCEF) 2-4 GM/100ML-% IVPB       ?Note to Pharmacy: Olena Mater F: cabinet override  ?    09/03/21 0809 09/03/21 2146  ? 09/03/21 0600  ceFAZolin (ANCEF) IVPB 2g/100 mL premix       ? 2 g ?200 mL/hr over 30 Minutes Intravenous On call to O.R. 09/03/21 0104 09/03/21 0946  ? ?  ?. ? ?She was given sequential compression devices, early ambulation, and Lovenox TEDs for DVT prophylaxis. ? ?She benefited maximally from the hospital stay and there were no complications.   ? ?Recent vital signs:  ?Vitals:  ? 09/04/21 0816 09/04/21 1159  ?BP: 139/66 130/69  ?Pulse: 72 73  ?Resp: 18 16  ?Temp: 97.9 ?F (36.6 ?C) 98.9 ?F (37.2 ?C)  ?SpO2: 93% 98%  ? ? ?Recent laboratory studies:  ?Lab Results  ?Component Value Date  ? HGB 11.8 (L) 09/04/2021  ? HGB 13.0 09/03/2021  ? ?Lab Results  ?Component Value Date  ? WBC 9.3 09/04/2021  ? PLT 213 09/04/2021  ? ?No results found for: INR ?Lab Results  ?Component Value Date  ? NA 135 09/04/2021  ? K 3.9 09/04/2021  ? CL 102 09/04/2021  ? CO2 27 09/04/2021  ? BUN 16 09/04/2021  ? CREATININE 0.72 09/04/2021  ? GLUCOSE 136 (H) 09/04/2021  ? ? ?  Discharge Medications:   ?Allergies as of 09/04/2021   ? ?   Reactions  ? Codeine Other (See Comments)  ? ABD.PAIN  ? ?  ? ?  ?Medication List  ?  ? ?STOP taking these medications   ? ?acetaminophen 500 MG tablet ?Commonly known as: TYLENOL ?  ? ?  ? ?TAKE these medications   ? ?amLODipine 5 MG tablet ?Commonly known as: NORVASC ?Take 5 mg by mouth daily. ?  ?docusate sodium 100 MG capsule ?Commonly known as: COLACE ?Take 1 capsule (100 mg total) by mouth 2 (two) times daily. ?  ?enoxaparin 40 MG/0.4ML injection ?Commonly known as: LOVENOX ?Inject 0.4 mLs (40 mg total) into the skin daily for 14 days. ?Start taking on: Sep 05, 2021 ?  ?fluticasone 50 MCG/ACT nasal spray ?Commonly known as: FLONASE ?Place 1 spray into both nostrils daily as needed for allergies or  rhinitis. ?  ?hydrochlorothiazide 12.5 MG tablet ?Commonly known as: HYDRODIURIL ?Take 12.5 mg by mouth daily. ?  ?HYDROcodone-acetaminophen 5-325 MG tablet ?Commonly known as: NORCO/VICODIN ?Take 1-2 tablets by mouth every 4 (four) hours as needed for moderate pain (pain score 4-6). ?  ?levothyroxine 125 MCG tablet ?Commonly known as: SYNTHROID ?Take 125 mcg by mouth daily before breakfast. ?  ?losartan 100 MG tablet ?Commonly known as: COZAAR ?Take 100 mg by mouth daily. ?  ?omeprazole 20 MG capsule ?Commonly known as: PRILOSEC ?Take 20 mg by mouth 2 (two) times daily before a meal. ?  ?traMADol 50 MG tablet ?Commonly known as: ULTRAM ?Take 1 tablet (50 mg total) by mouth every 6 (six) hours. ?  ?Vitamin D (Ergocalciferol) 1.25 MG (50000 UNIT) Caps capsule ?Commonly known as: DRISDOL ?Take 50,000 Units by mouth every 7 (seven) days. ?  ? ?  ? ?  ?  ? ? ?  ?Durable Medical Equipment  ?(From admission, onward)  ?  ? ? ?  ? ?  Start     Ordered  ? 09/03/21 1358  DME Walker rolling  Once       ?Question Answer Comment  ?Walker: With 5 Inch Wheels   ?Patient needs a walker to treat with the following condition Status post total hip replacement, right   ?  ? 09/03/21 1357  ? 09/03/21 1358  DME 3 n 1  Once       ? 09/03/21 1357  ? 09/03/21 1358  DME Bedside commode  Once       ?Question:  Patient needs a bedside commode to treat with the following condition  Answer:  Status post total hip replacement, right  ? 09/03/21 1357  ? ?  ?  ? ?  ? ? ?Diagnostic Studies: DG C-Arm 1-60 Min-No Report ? ?Result Date: 09/03/2021 ?Fluoroscopy was utilized by the requesting physician.  No radiographic interpretation.  ? ?DG HIP UNILAT WITH PELVIS 1V RIGHT ? ?Result Date: 09/03/2021 ?CLINICAL DATA:  Right hip arthroplasty EXAM: DG HIP (WITH OR WITHOUT PELVIS) 1V RIGHT COMPARISON:  None Available. FINDINGS: Intraoperative images during right hip arthroplasty. Hardware is in normal alignment without evidence of loosening or periprosthetic  fracture. IMPRESSION: Intraoperative images during right hip arthroplasty. No evidence of immediate complication. Electronically Signed   By: Maurine Simmering M.D.   On: 09/03/2021 11:25  ? ?DG HIP UNILAT W OR W/O PELVIS 2-3 VIEWS RIGHT ? ?Result Date: 09/03/2021 ?CLINICAL DATA:  Status post right total hip replacement. EXAM: DG HIP (WITH OR WITHOUT PELVIS) 2-3V RIGHT COMPARISON:  Intraoperative radiograph Sep 03, 2021 at 1032 hours FINDINGS: Postsurgical change of right total hip arthroplasty without evidence of acute complication. IMPRESSION: Postsurgical change of right total hip arthroplasty without evidence of acute complication. Electronically Signed   By: Dahlia Bailiff M.D.   On: 09/03/2021 11:49   ? ?Disposition:  ? ? ? ? Contact information for follow-up providers   ? ? Duanne Guess, PA-C Follow up in 2 week(s).   ?Specialties: Orthopedic Surgery, Emergency Medicine ?Contact information: ?[place] [address] ?[phone] ? ? ?  ?  ? ?  ?  ? ? Contact information for after-discharge care   ? ? Destination   ? ? [facility] .   ?Service: Skilled Nursing ?Contact information: ?[address] ?[place] ?[phone] ? ?  ?  ? ?  ?  ? ?  ?  ? ?  ? ? ? ?Signed: ?Dorise Hiss CHRISTOPHER ?09/04/2021, 12:25 PM ? ? ?  ?

## 2021-09-04 NOTE — Progress Notes (Signed)
Called  to [facility]  report given to [employer] all questions answered to satisfaction. ?

## 2021-09-04 NOTE — Discharge Instructions (Signed)
?ANTERIOR APPROACH TOTAL HIP REPLACEMENT POSTOPERATIVE DIRECTIONS ? ? ?Hip Rehabilitation, Guidelines Following Surgery  ?The results of a hip operation are greatly improved after range of motion and muscle strengthening exercises. Follow all safety measures which are given to protect your hip. If any of these exercises cause increased pain or swelling in your joint, decrease the amount until you are comfortable again. Then slowly increase the exercises. Call your caregiver if you have problems or questions.  ? ?HOME CARE INSTRUCTIONS  ?Remove items at home which could result in a fall. This includes throw rugs or furniture in walking pathways.  ?ICE to the affected hip every three hours for 30 minutes at a time and then as needed for pain and swelling.  Continue to use ice on the hip for pain and swelling from surgery. You may notice swelling that will progress down to the foot and ankle.  This is normal after surgery.  Elevate the leg when you are not up walking on it.   ?Continue to use the breathing machine which will help keep your temperature down.  It is common for your temperature to cycle up and down following surgery, especially at night when you are not up moving around and exerting yourself.  The breathing machine keeps your lungs expanded and your temperature down. ?Do not place pillow under knee, focus on keeping the knee straight while resting ? ?DIET ?You may resume your previous home diet once your are discharged from the hospital. ? ?DRESSING / WOUND CARE / SHOWERING ?Please remove provena negative pressure dressing on 09/11/2021 and apply honey comb dressing. Keep dressing clean and dry at all times.  ? ? ?ACTIVITY ?Walk with your walker as instructed. ?Use walker as long as suggested by your caregivers. ?Avoid periods of inactivity such as sitting longer than an hour when not asleep. This helps prevent blood clots.  ?You may resume a sexual relationship in one month or when given the OK by your  doctor.  ?You may return to work once you are cleared by your doctor.  ?Do not drive a car for 6 weeks or until released by you surgeon.  ?Do not drive while taking narcotics. ? ?WEIGHT BEARING ?Weight bearing as tolerated. Use walker/cane as needed for at least 4 weeks post op. ? ?POSTOPERATIVE CONSTIPATION PROTOCOL ?Constipation - defined medically as fewer than three stools per week and severe constipation as less than one stool per week. ? ?One of the most common issues patients have following surgery is constipation.  Even if you have a regular bowel pattern at home, your normal regimen is likely to be disrupted due to multiple reasons following surgery.  Combination of anesthesia, postoperative narcotics, change in appetite and fluid intake all can affect your bowels.  In order to avoid complications following surgery, here are some recommendations in order to help you during your recovery period. ? ?Colace (docusate) - Pick up an over-the-counter form of Colace or another stool softener and take twice a day as long as you are requiring postoperative pain medications.  Take with a full glass of water daily.  If you experience loose stools or diarrhea, hold the colace until you stool forms back up.  If your symptoms do not get better within 1 week or if they get worse, check with your doctor. ? ?Dulcolax (bisacodyl) - Pick up over-the-counter and take as directed by the product packaging as needed to assist with the movement of your bowels.  Take with a full glass  of water.  Use this product as needed if not relieved by Colace only.  ? ?MiraLax (polyethylene glycol) - Pick up over-the-counter to have on hand.  MiraLax is a solution that will increase the amount of water in your bowels to assist with bowel movements.  Take as directed and can mix with a glass of water, juice, soda, coffee, or tea.  Take if you go more than two days without a movement. ?Do not use MiraLax more than once per day. Call your doctor  if you are still constipated or irregular after using this medication for 7 days in a row. ? ?If you continue to have problems with postoperative constipation, please contact the office for further assistance and recommendations.  If you experience "the worst abdominal pain ever" or develop nausea or vomiting, please contact the office immediatly for further recommendations for treatment. ? ?ITCHING ? If you experience itching with your medications, try taking only a single pain pill, or even half a pain pill at a time.  You can also use Benadryl over the counter for itching or also to help with sleep.  ? ?TED HOSE STOCKINGS ?Wear the elastic stockings on both legs for six weeks following surgery during the day but you may remove then at night for sleeping. ? ?MEDICATIONS ?See your medication summary on the ?After Visit Summary? that the nursing staff will review with you prior to discharge.  You may have some home medications which will be placed on hold until you complete the course of blood thinner medication.  It is important for you to complete the blood thinner medication as prescribed by your surgeon.  Continue your approved medications as instructed at time of discharge. ? ?PRECAUTIONS ?If you experience chest pain or shortness of breath - call 911 immediately for transfer to the hospital emergency department.  ?If you develop a fever greater that 101 F, purulent drainage from wound, increased redness or drainage from wound, foul odor from the wound/dressing, or calf pain - CONTACT YOUR SURGEON.   ?                                                ?FOLLOW-UP APPOINTMENTS ?Make sure you keep all of your appointments after your operation with your surgeon and caregivers. You should call the office at the above phone number and make an appointment for approximately two weeks after the date of your surgery or on the date instructed by your surgeon outlined in the "After Visit Summary". ? ?RANGE OF MOTION AND  STRENGTHENING EXERCISES  ?These exercises are designed to help you keep full movement of your hip joint. Follow your caregiver's or physical therapist's instructions. Perform all exercises about fifteen times, three times per day or as directed. Exercise both hips, even if you have had only one joint replacement. These exercises can be done on a training (exercise) mat, on the floor, on a table or on a bed. Use whatever works the best and is most comfortable for you. Use music or television while you are exercising so that the exercises are a pleasant break in your day. This will make your life better with the exercises acting as a break in routine you can look forward to.  ?Lying on your back, slowly slide your foot toward your buttocks, raising your knee up off the floor. Then slowly slide  your foot back down until your leg is straight again.  ?Lying on your back spread your legs as far apart as you can without causing discomfort.  ?Lying on your side, raise your upper leg and foot straight up from the floor as far as is comfortable. Slowly lower the leg and repeat.  ?Lying on your back, tighten up the muscle in the front of your thigh (quadriceps muscles). You can do this by keeping your leg straight and trying to raise your heel off the floor. This helps strengthen the largest muscle supporting your knee.  ?Lying on your back, tighten up the muscles of your buttocks both with the legs straight and with the knee bent at a comfortable angle while keeping your heel on the floor.  ? ?IF YOU ARE TRANSFERRED TO A SKILLED REHAB FACILITY ?If the patient is transferred to a skilled rehab facility following release from the hospital, a list of the current medications will be sent to the facility for the patient to continue.  When discharged from the skilled rehab facility, please have the facility set up the patient's [place] prior to being released. Also, the skilled facility will be responsible for  providing the patient with their medications at time of release from the facility to include their pain medication, the muscle relaxants, and their blood thinner medication. If the patient is still at the r

## 2021-09-04 NOTE — TOC Progression Note (Addendum)
Transition of Care (TOC) - Progression Note  ? ? ?Patient Details  ?Name: KONNI KESINGER ?MRN: [id number] ?Date of Birth: 01/31/1946 ? ?Transition of Care (TOC) CM/SW Contact  ?Conception Oms, RN ?Phone Number: ?09/04/2021, 10:00 AM ? ?Clinical Narrative:    ?Called [facility] and spoke to Tammy to request Ins auth to be approved to go to [facility], Also requested EMS approval for [facility], Ins is pending, they will let me know once approved  ?Daughter is aware ? ?Expected Discharge Plan:  (undetermined) ?Barriers to Discharge: Continued Medical Work up ? ?Expected Discharge Plan and Services ?Expected Discharge Plan:  (undetermined) ?In-house Referral: Clinical Social Work ?  ?Post Acute Care Choice:  (undetermined) ?Living arrangements for the past 2 months: [place] ?                ?  ?  ?  ?  ?  ?  ?  ?  ?  ?  ? ? ?Social Determinants of Health (SDOH) Interventions ?  ? ?Readmission Risk Interventions ?   ? View : No data to display.  ?  ?  ?  ? ? ?

## 2021-09-04 NOTE — Progress Notes (Signed)
Physical Therapy Treatment ?Patient Details ?Name: Brandi Schwartz ?MRN: [id number] ?DOB: Sep 15, 1945 ?Today's Date: 09/04/2021 ? ? ?History of Present Illness admitted s/p R THA, anterior approach, WBAT (09/03/21) ? ?  ?PT Comments  ? ? Pt was sitting in recliner upon arriving. She is A and O x 4 and agreeable to session. Pt lives alone and is unable to have someone stay with her at DC. She required supervision for standing, ambulation however does require min assist with stair training and getting into bed after OOB activity. Pt is hoping to DC to [facility] once cleared medically. Author will return later this afternoon and continue to follow per current POC.  ?  ?Recommendations for follow up therapy are one component of a multi-disciplinary discharge planning process, led by the attending physician.  Recommendations may be updated based on patient status, additional functional criteria and insurance authorization. ? ?Follow Up Recommendations ? Follow physician's recommendations for discharge plan and follow up therapies (pt is hoping to DC to [facility] due to lack of assistance at home.) ?  ?  ?Assistance Recommended at Discharge Intermittent Supervision/Assistance  ?Patient can return home with the following A little help with walking and/or transfers;A little help with bathing/dressing/bathroom ?  ?Equipment Recommendations ? Rolling walker (2 wheels);BSC/3in1  ?  ?   ?Precautions / Restrictions Precautions ?Precautions: Fall;Anterior Hip ?Restrictions ?Weight Bearing Restrictions: Yes ?RLE Weight Bearing: Weight bearing as tolerated  ?  ? ?Mobility ? Bed Mobility ?   ?General bed mobility comments: In recliner pre/post session ?  ? ?Transfers ?Overall transfer level: Needs assistance ?Equipment used: Rolling walker (2 wheels) ?Transfers: Sit to/from Stand ?Sit to Stand: Supervision ? General transfer comment: vc for technique improvements only ?  ? ?Ambulation/Gait ?Ambulation/Gait assistance:  Supervision ?Gait Distance (Feet): 150 Feet ?Assistive device: Rolling walker (2 wheels) ?Gait Pattern/deviations: Step-through pattern ?Gait velocity: decreased ?  ?  ?General Gait Details: Pt demonstrates safe steady gait kinematics without LOB or safety concern ? ? ?Stairs ?Stairs: Yes ?Stairs assistance: Min guard, Min assist ?Stair Management: One rail Right, Sideways, Step to pattern, Backwards, No rails ?Number of Stairs: 4 ?General stair comments: Pt performed ascending/descending 4 stair with +1 R rail performing sideways step to pattern. She also performed ascending going backwards + descending forwards however struggles (min assist). ?  ?Balance Overall balance assessment: Needs assistance ?Sitting-balance support: Feet supported, No upper extremity supported ?Sitting balance-Leahy Scale: Good ?  ?  ?Standing balance support: Bilateral upper extremity supported, Reliant on assistive device for balance, During functional activity ?Standing balance-Leahy Scale: Good ?  ?  ?  ?Cognition Arousal/Alertness: Awake/alert ?Behavior During Therapy: [facility] for tasks assessed/performed ?Overall Cognitive Status: Within Functional Limits for tasks assessed ?  ?   ?General Comments: Pt is A and O x 4 ?  ?  ? ?  ?   ?   ? ?Pertinent Vitals/Pain Pain Assessment ?Pain Assessment: 0-10 ?Pain Score: 6  ?Faces Pain Scale: Hurts little more ?Pain Location: R hip ?Pain Descriptors / Indicators: Aching, Guarding ?Pain Intervention(s): Limited activity within patient's tolerance, Monitored during session, Premedicated before session, Repositioned, Ice applied  ? ? ?Home Living Family/patient expects to be discharged to:: Skilled nursing facility ?Living Arrangements: Alone ?Available Help at Discharge: Family;Available PRN/intermittently ?Type of Home: House ?Home Access: Stairs to enter ?Entrance Stairs-Rails: Right;Left ?Entrance Stairs-Number of Steps: 3 ?  ?Home Layout: One level ?Home Equipment: Grab bars - tub/shower ?   ?   ?   ? ?  PT Goals (current goals can now be found in the care plan section) Acute Rehab PT Goals ?Patient Stated Goal: rehab then home ?Progress towards PT goals: Progressing toward goals ? ?  ?Frequency ? ? ? BID ? ? ? ?  ?PT Plan Current plan remains appropriate  ? ? ?   ?AM-PAC PT "6 Clicks" Mobility   ?Outcome Measure ? Help needed turning from your back to your side while in a flat bed without using bedrails?: A Little ?Help needed moving from lying on your back to sitting on the side of a flat bed without using bedrails?: A Little ?Help needed moving to and from a bed to a chair (including a wheelchair)?: A Little ?Help needed standing up from a chair using your arms (e.g., wheelchair or bedside chair)?: A Little ?Help needed to walk in hospital room?: A Little ?Help needed climbing 3-5 steps with a railing? : A Little ?6 Click Score: 18 ? ?  ?End of Session   ?Activity Tolerance: Patient tolerated treatment well ?Patient left: with call bell/phone within reach;with bed alarm set;in bed ?Nurse Communication: Mobility status ?PT Visit Diagnosis: Muscle weakness (generalized) (M62.81);Difficulty in walking, not elsewhere classified (R26.2);Pain ?Pain - Right/Left: Right ?Pain - part of body: Hip ?  ? ? ?Time: 0950-1020 ?PT Time Calculation (min) (ACUTE ONLY): 30 min ? ?Charges:  $Gait Training: 8-22 mins ?$Therapeutic Activity: 8-22 mins          ?          ? ?Julaine Fusi PTA ?09/04/21, 11:18 AM  ? ?

## 2021-09-04 NOTE — Progress Notes (Signed)
Removed foley catheter, approximately @ 0600 awaiting pt to void. ?Louanne Skye ?09/04/21 ?6:10 AM ? ?

## 2021-09-04 NOTE — TOC Progression Note (Signed)
Transition of Care (TOC) - Progression Note  ? ? ?Patient Details  ?Name: Brandi Schwartz ?MRN: [id number] ?Date of Birth: 1945-08-13 ? ?Transition of Care (TOC) CM/SW Contact  ?Conception Oms, RN ?Phone Number: ?09/04/2021, 11:26 AM ? ?Clinical Narrative:    ?[facility], [facility] called with Ins approval to go to [facility] [place] number [phone], EMS approved as well Auth number [id number] ? ? ?Expected Discharge Plan:  (undetermined) ?Barriers to Discharge: Continued Medical Work up ? ?Expected Discharge Plan and Services ?Expected Discharge Plan:  (undetermined) ?In-house Referral: Clinical Social Work ?  ?Post Acute Care Choice:  (undetermined) ?Living arrangements for the past 2 months: [place] ?                ?  ?  ?  ?  ?  ?  ?  ?  ?  ?  ? ? ?Social Determinants of Health (SDOH) Interventions ?  ? ?Readmission Risk Interventions ?   ? View : No data to display.  ?  ?  ?  ? ? ?

## 2021-09-04 NOTE — Plan of Care (Signed)
  Problem: Education: Goal: Knowledge of the prescribed therapeutic regimen will improve Outcome: Progressing   

## 2021-09-04 NOTE — Progress Notes (Signed)
? ?  Subjective: ?1 Day Post-Op Procedure(s) (LRB): ?TOTAL HIP ARTHROPLASTY ANTERIOR APPROACH (Right) ?Patient reports pain as mild.   ?Patient is well, and has had no acute complaints or problems ?Denies any CP, SOB, ABD pain. ?We will continue therapy today.  ?Plan is to go Skilled nursing facility after hospital stay. ? ?Objective: ?Vital signs in last 24 hours: ?Temp:  [97.1 ?F (36.2 ?C)-98.2 ?F (36.8 ?C)] 97.9 ?F (36.6 ?C) (05/03 4982) ?Pulse Rate:  [68-92] 72 (05/03 0816) ?Resp:  [15-24] 18 (05/03 0816) ?BP: (100-153)/(48-87) 139/66 (05/03 0816) ?SpO2:  [84 %-98 %] 93 % (05/03 0816) ?Weight:  [86.2 kg] 86.2 kg (05/02 0821) ? ?Intake/Output from previous day: ?05/02 0701 - 05/03 0700 ?In: 400 [P.O.:200; I.V.:200] ?Out: 1600 [Urine:1450; Blood:150] ?Intake/Output this shift: ?No intake/output data recorded. ? ?Recent Labs  ?  09/03/21 ?1441 09/04/21 ?0426  ?HGB 13.0 11.8*  ? ?Recent Labs  ?  09/03/21 ?1441 09/04/21 ?0426  ?WBC 11.9* 9.3  ?RBC 4.10 3.57*  ?HCT 40.2 35.3*  ?PLT 238 213  ? ?Recent Labs  ?  09/03/21 ?1441 09/04/21 ?0426  ?NA  --  135  ?K  --  3.9  ?CL  --  102  ?CO2  --  27  ?BUN  --  16  ?CREATININE 0.69 0.72  ?GLUCOSE  --  136*  ?CALCIUM  --  8.8*  ? ?No results for input(s): LABPT, INR in the last 72 hours. ? ?EXAM ?General - Patient is Alert, Appropriate, and Oriented ?Extremity - Neurovascular intact ?Sensation intact distally ?Intact pulses distally ?Dorsiflexion/Plantar flexion intact ?Dressing - dressing C/D/I and no drainage, preovena intact with out drainage ?Motor Function - intact, moving foot and toes well on exam.  ? ?Past Medical History:  ?Diagnosis Date  ? Anginal pain ([place])   ? neg stress test, neg ECHO tes  ? Arthritis   ? GERD (gastroesophageal reflux disease)   ? History of colonic polyps   ? History of shingles   ? Hoarseness, chronic   ? Hypertension   ? Hypothyroidism   ? Osteoporosis   ? PONV (postoperative nausea and vomiting)   ? many yrs ago.  None recently  ? Wears  dentures   ? full upper  ? ? ?Assessment/Plan:   ?1 Day Post-Op Procedure(s) (LRB): ?TOTAL HIP ARTHROPLASTY ANTERIOR APPROACH (Right) ?Principal Problem: ?  Status post total hip replacement, right ? ?Estimated body mass index is 32.61 kg/m? as calculated from the following: ?  Height as of this encounter: '5\' 4"'$  (1.626 m). ?  Weight as of this encounter: 86.2 kg. ?Advance diet ?Up with therapy ?Labs and VSS ?Pain controlled ?CM to assist with discharge to SNF, patient lives at home alone with no help ? ?DVT Prophylaxis - Lovenox, TED hose, and SCDs ?Weight-Bearing as tolerated to right leg ? ? ?T. Rachelle Hora, PA-C ?[place] ?09/04/2021, 8:20 AM ?  ?

## 2021-09-04 NOTE — TOC Progression Note (Signed)
Transition of Care (TOC) - Progression Note  ? ? ?Patient Details  ?Name: Brandi Schwartz ?MRN: [id number] ?Date of Birth: 05/04/1946 ? ?Transition of Care (TOC) CM/SW Contact  ?Conception Oms, RN ?Phone Number: ?09/04/2021, 2:08 PM ? ?Clinical Narrative:    ?The patient will go to [facility] room 105 ?To [facility] ?Cyril Mourning her daughter is aware ?EMS called, she is number 1 on the list ? ? ? ?Expected Discharge Plan:  (undetermined) ?Barriers to Discharge: Continued Medical Work up ? ?Expected Discharge Plan and Services ?Expected Discharge Plan:  (undetermined) ?In-house Referral: Clinical Social Work ?  ?Post Acute Care Choice:  (undetermined) ?Living arrangements for the past 2 months: [place] ?Expected Discharge Date: 09/04/21               ?  ?  ?  ?  ?  ?  ?  ?  ?  ?  ? ? ?Social Determinants of Health (SDOH) Interventions ?  ? ?Readmission Risk Interventions ?   ? View : No data to display.  ?  ?  ?  ? ? ?

## 2021-09-05 DIAGNOSIS — K219 Gastro-esophageal reflux disease without esophagitis: Secondary | ICD-10-CM | POA: Diagnosis not present

## 2021-09-05 DIAGNOSIS — I10 Essential (primary) hypertension: Secondary | ICD-10-CM | POA: Diagnosis not present

## 2021-09-05 DIAGNOSIS — M1611 Unilateral primary osteoarthritis, right hip: Secondary | ICD-10-CM | POA: Diagnosis not present

## 2021-09-05 DIAGNOSIS — E039 Hypothyroidism, unspecified: Secondary | ICD-10-CM | POA: Diagnosis not present

## 2021-09-05 LAB — TYPE AND SCREEN
ABO/RH(D): A NEG
Antibody Screen: POSITIVE
Unit division: 0
Unit division: 0

## 2021-09-05 LAB — BPAM RBC
Blood Product Expiration Date: 202305302359
Blood Product Expiration Date: 202305312359
Unit Type and Rh: 600
Unit Type and Rh: 600

## 2021-09-05 LAB — SURGICAL PATHOLOGY

## 2021-09-06 ENCOUNTER — Encounter: Payer: Self-pay | Admitting: Orthopedic Surgery

## 2021-09-06 NOTE — Anesthesia Postprocedure Evaluation (Signed)
Anesthesia Post Note ? ?Patient: Brandi Schwartz ? ?Procedure(s) Performed: TOTAL HIP ARTHROPLASTY ANTERIOR APPROACH (Right: Hip) ? ?Patient location during evaluation: Nursing Unit ?Anesthesia Type: Spinal ?Level of consciousness: oriented and awake and alert ?Pain management: pain level controlled ?Vital Signs Assessment: post-procedure vital signs reviewed and stable ?Respiratory status: spontaneous breathing and respiratory function stable ?Cardiovascular status: blood pressure returned to baseline and stable ?Postop Assessment: no headache, no backache, no apparent nausea or vomiting and patient able to bend at knees ?Anesthetic complications: no ? ? ?No notable events documented. ? ? ?Last Vitals:  ?Vitals:  ? 09/04/21 0816 09/04/21 1159  ?BP: 139/66 130/69  ?Pulse: 72 73  ?Resp: 18 16  ?Temp: 36.6 ?C 37.2 ?C  ?SpO2: 93% 98%  ?  ?Last Pain:  ?Vitals:  ? 09/04/21 0816  ?TempSrc: Oral  ?PainSc: 6   ? ? ?  ?  ?  ?  ?  ?  ? ?Martha Clan ? ? ? ? ?

## 2021-09-09 DIAGNOSIS — G479 Sleep disorder, unspecified: Secondary | ICD-10-CM | POA: Diagnosis not present

## 2021-09-11 DIAGNOSIS — Z9181 History of falling: Secondary | ICD-10-CM | POA: Diagnosis not present

## 2021-09-11 DIAGNOSIS — Z96641 Presence of right artificial hip joint: Secondary | ICD-10-CM | POA: Diagnosis not present

## 2021-09-11 DIAGNOSIS — K219 Gastro-esophageal reflux disease without esophagitis: Secondary | ICD-10-CM | POA: Diagnosis not present

## 2021-09-11 DIAGNOSIS — Z86010 Personal history of colonic polyps: Secondary | ICD-10-CM | POA: Diagnosis not present

## 2021-09-11 DIAGNOSIS — I10 Essential (primary) hypertension: Secondary | ICD-10-CM | POA: Diagnosis not present

## 2021-09-11 DIAGNOSIS — E039 Hypothyroidism, unspecified: Secondary | ICD-10-CM | POA: Diagnosis not present

## 2021-09-11 DIAGNOSIS — E785 Hyperlipidemia, unspecified: Secondary | ICD-10-CM | POA: Diagnosis not present

## 2021-09-11 DIAGNOSIS — Z471 Aftercare following joint replacement surgery: Secondary | ICD-10-CM | POA: Diagnosis not present

## 2021-09-11 DIAGNOSIS — M810 Age-related osteoporosis without current pathological fracture: Secondary | ICD-10-CM | POA: Diagnosis not present

## 2021-09-11 DIAGNOSIS — J309 Allergic rhinitis, unspecified: Secondary | ICD-10-CM | POA: Diagnosis not present

## 2021-09-11 DIAGNOSIS — M19049 Primary osteoarthritis, unspecified hand: Secondary | ICD-10-CM | POA: Diagnosis not present

## 2021-09-11 DIAGNOSIS — G479 Sleep disorder, unspecified: Secondary | ICD-10-CM | POA: Diagnosis not present

## 2021-09-13 NOTE — Progress Notes (Signed)
Aprepitant given for PONV prophylaxis in a patient with a history of PONV ?

## 2021-09-23 DIAGNOSIS — Z96641 Presence of right artificial hip joint: Secondary | ICD-10-CM | POA: Diagnosis not present

## 2021-09-23 DIAGNOSIS — J309 Allergic rhinitis, unspecified: Secondary | ICD-10-CM | POA: Diagnosis not present

## 2021-09-23 DIAGNOSIS — M810 Age-related osteoporosis without current pathological fracture: Secondary | ICD-10-CM | POA: Diagnosis not present

## 2021-09-23 DIAGNOSIS — E039 Hypothyroidism, unspecified: Secondary | ICD-10-CM | POA: Diagnosis not present

## 2021-09-23 DIAGNOSIS — Z86010 Personal history of colonic polyps: Secondary | ICD-10-CM | POA: Diagnosis not present

## 2021-09-23 DIAGNOSIS — M19049 Primary osteoarthritis, unspecified hand: Secondary | ICD-10-CM | POA: Diagnosis not present

## 2021-09-23 DIAGNOSIS — K219 Gastro-esophageal reflux disease without esophagitis: Secondary | ICD-10-CM | POA: Diagnosis not present

## 2021-09-23 DIAGNOSIS — E785 Hyperlipidemia, unspecified: Secondary | ICD-10-CM | POA: Diagnosis not present

## 2021-09-23 DIAGNOSIS — I10 Essential (primary) hypertension: Secondary | ICD-10-CM | POA: Diagnosis not present

## 2021-09-23 DIAGNOSIS — G479 Sleep disorder, unspecified: Secondary | ICD-10-CM | POA: Diagnosis not present

## 2021-09-23 DIAGNOSIS — Z471 Aftercare following joint replacement surgery: Secondary | ICD-10-CM | POA: Diagnosis not present

## 2021-09-23 DIAGNOSIS — Z9181 History of falling: Secondary | ICD-10-CM | POA: Diagnosis not present

## 2021-09-25 DIAGNOSIS — Z9181 History of falling: Secondary | ICD-10-CM | POA: Diagnosis not present

## 2021-09-25 DIAGNOSIS — I10 Essential (primary) hypertension: Secondary | ICD-10-CM | POA: Diagnosis not present

## 2021-09-25 DIAGNOSIS — M810 Age-related osteoporosis without current pathological fracture: Secondary | ICD-10-CM | POA: Diagnosis not present

## 2021-09-25 DIAGNOSIS — Z471 Aftercare following joint replacement surgery: Secondary | ICD-10-CM | POA: Diagnosis not present

## 2021-09-25 DIAGNOSIS — Z86010 Personal history of colonic polyps: Secondary | ICD-10-CM | POA: Diagnosis not present

## 2021-09-25 DIAGNOSIS — E785 Hyperlipidemia, unspecified: Secondary | ICD-10-CM | POA: Diagnosis not present

## 2021-09-25 DIAGNOSIS — J309 Allergic rhinitis, unspecified: Secondary | ICD-10-CM | POA: Diagnosis not present

## 2021-09-25 DIAGNOSIS — M19049 Primary osteoarthritis, unspecified hand: Secondary | ICD-10-CM | POA: Diagnosis not present

## 2021-09-25 DIAGNOSIS — K219 Gastro-esophageal reflux disease without esophagitis: Secondary | ICD-10-CM | POA: Diagnosis not present

## 2021-09-25 DIAGNOSIS — E039 Hypothyroidism, unspecified: Secondary | ICD-10-CM | POA: Diagnosis not present

## 2021-09-25 DIAGNOSIS — G479 Sleep disorder, unspecified: Secondary | ICD-10-CM | POA: Diagnosis not present

## 2021-09-25 DIAGNOSIS — Z96641 Presence of right artificial hip joint: Secondary | ICD-10-CM | POA: Diagnosis not present

## 2021-09-27 DIAGNOSIS — Z96641 Presence of right artificial hip joint: Secondary | ICD-10-CM | POA: Diagnosis not present

## 2021-09-27 DIAGNOSIS — M19049 Primary osteoarthritis, unspecified hand: Secondary | ICD-10-CM | POA: Diagnosis not present

## 2021-09-27 DIAGNOSIS — I10 Essential (primary) hypertension: Secondary | ICD-10-CM | POA: Diagnosis not present

## 2021-09-27 DIAGNOSIS — Z9181 History of falling: Secondary | ICD-10-CM | POA: Diagnosis not present

## 2021-09-27 DIAGNOSIS — G479 Sleep disorder, unspecified: Secondary | ICD-10-CM | POA: Diagnosis not present

## 2021-09-27 DIAGNOSIS — E039 Hypothyroidism, unspecified: Secondary | ICD-10-CM | POA: Diagnosis not present

## 2021-09-27 DIAGNOSIS — Z471 Aftercare following joint replacement surgery: Secondary | ICD-10-CM | POA: Diagnosis not present

## 2021-09-27 DIAGNOSIS — Z86010 Personal history of colonic polyps: Secondary | ICD-10-CM | POA: Diagnosis not present

## 2021-09-27 DIAGNOSIS — K219 Gastro-esophageal reflux disease without esophagitis: Secondary | ICD-10-CM | POA: Diagnosis not present

## 2021-09-27 DIAGNOSIS — J309 Allergic rhinitis, unspecified: Secondary | ICD-10-CM | POA: Diagnosis not present

## 2021-09-27 DIAGNOSIS — M810 Age-related osteoporosis without current pathological fracture: Secondary | ICD-10-CM | POA: Diagnosis not present

## 2021-09-27 DIAGNOSIS — E785 Hyperlipidemia, unspecified: Secondary | ICD-10-CM | POA: Diagnosis not present

## 2021-10-01 DIAGNOSIS — R451 Restlessness and agitation: Secondary | ICD-10-CM | POA: Diagnosis not present

## 2021-10-01 DIAGNOSIS — F411 Generalized anxiety disorder: Secondary | ICD-10-CM | POA: Diagnosis not present

## 2021-10-01 DIAGNOSIS — F5101 Primary insomnia: Secondary | ICD-10-CM | POA: Diagnosis not present

## 2021-10-04 DIAGNOSIS — K219 Gastro-esophageal reflux disease without esophagitis: Secondary | ICD-10-CM | POA: Diagnosis not present

## 2021-10-04 DIAGNOSIS — I10 Essential (primary) hypertension: Secondary | ICD-10-CM | POA: Diagnosis not present

## 2021-10-04 DIAGNOSIS — Z471 Aftercare following joint replacement surgery: Secondary | ICD-10-CM | POA: Diagnosis not present

## 2021-10-04 DIAGNOSIS — E785 Hyperlipidemia, unspecified: Secondary | ICD-10-CM | POA: Diagnosis not present

## 2021-10-04 DIAGNOSIS — J309 Allergic rhinitis, unspecified: Secondary | ICD-10-CM | POA: Diagnosis not present

## 2021-10-04 DIAGNOSIS — E039 Hypothyroidism, unspecified: Secondary | ICD-10-CM | POA: Diagnosis not present

## 2021-10-04 DIAGNOSIS — M810 Age-related osteoporosis without current pathological fracture: Secondary | ICD-10-CM | POA: Diagnosis not present

## 2021-10-04 DIAGNOSIS — M19049 Primary osteoarthritis, unspecified hand: Secondary | ICD-10-CM | POA: Diagnosis not present

## 2021-10-04 DIAGNOSIS — Z86010 Personal history of colonic polyps: Secondary | ICD-10-CM | POA: Diagnosis not present

## 2021-10-04 DIAGNOSIS — Z96641 Presence of right artificial hip joint: Secondary | ICD-10-CM | POA: Diagnosis not present

## 2021-10-04 DIAGNOSIS — G479 Sleep disorder, unspecified: Secondary | ICD-10-CM | POA: Diagnosis not present

## 2021-10-04 DIAGNOSIS — Z9181 History of falling: Secondary | ICD-10-CM | POA: Diagnosis not present

## 2021-10-07 DIAGNOSIS — M810 Age-related osteoporosis without current pathological fracture: Secondary | ICD-10-CM | POA: Diagnosis not present

## 2021-10-07 DIAGNOSIS — E785 Hyperlipidemia, unspecified: Secondary | ICD-10-CM | POA: Diagnosis not present

## 2021-10-07 DIAGNOSIS — K219 Gastro-esophageal reflux disease without esophagitis: Secondary | ICD-10-CM | POA: Diagnosis not present

## 2021-10-07 DIAGNOSIS — Z96641 Presence of right artificial hip joint: Secondary | ICD-10-CM | POA: Diagnosis not present

## 2021-10-07 DIAGNOSIS — G479 Sleep disorder, unspecified: Secondary | ICD-10-CM | POA: Diagnosis not present

## 2021-10-07 DIAGNOSIS — Z9181 History of falling: Secondary | ICD-10-CM | POA: Diagnosis not present

## 2021-10-07 DIAGNOSIS — I10 Essential (primary) hypertension: Secondary | ICD-10-CM | POA: Diagnosis not present

## 2021-10-07 DIAGNOSIS — J309 Allergic rhinitis, unspecified: Secondary | ICD-10-CM | POA: Diagnosis not present

## 2021-10-07 DIAGNOSIS — M19049 Primary osteoarthritis, unspecified hand: Secondary | ICD-10-CM | POA: Diagnosis not present

## 2021-10-07 DIAGNOSIS — E039 Hypothyroidism, unspecified: Secondary | ICD-10-CM | POA: Diagnosis not present

## 2021-10-07 DIAGNOSIS — Z86010 Personal history of colonic polyps: Secondary | ICD-10-CM | POA: Diagnosis not present

## 2021-10-07 DIAGNOSIS — Z471 Aftercare following joint replacement surgery: Secondary | ICD-10-CM | POA: Diagnosis not present

## 2021-10-10 DIAGNOSIS — M810 Age-related osteoporosis without current pathological fracture: Secondary | ICD-10-CM | POA: Diagnosis not present

## 2021-10-10 DIAGNOSIS — I10 Essential (primary) hypertension: Secondary | ICD-10-CM | POA: Diagnosis not present

## 2021-10-10 DIAGNOSIS — K219 Gastro-esophageal reflux disease without esophagitis: Secondary | ICD-10-CM | POA: Diagnosis not present

## 2021-10-10 DIAGNOSIS — E039 Hypothyroidism, unspecified: Secondary | ICD-10-CM | POA: Diagnosis not present

## 2021-10-10 DIAGNOSIS — M19049 Primary osteoarthritis, unspecified hand: Secondary | ICD-10-CM | POA: Diagnosis not present

## 2021-10-10 DIAGNOSIS — G479 Sleep disorder, unspecified: Secondary | ICD-10-CM | POA: Diagnosis not present

## 2021-10-10 DIAGNOSIS — Z471 Aftercare following joint replacement surgery: Secondary | ICD-10-CM | POA: Diagnosis not present

## 2021-10-10 DIAGNOSIS — Z86010 Personal history of colonic polyps: Secondary | ICD-10-CM | POA: Diagnosis not present

## 2021-10-10 DIAGNOSIS — Z9181 History of falling: Secondary | ICD-10-CM | POA: Diagnosis not present

## 2021-10-10 DIAGNOSIS — Z96641 Presence of right artificial hip joint: Secondary | ICD-10-CM | POA: Diagnosis not present

## 2021-10-10 DIAGNOSIS — E785 Hyperlipidemia, unspecified: Secondary | ICD-10-CM | POA: Diagnosis not present

## 2021-10-10 DIAGNOSIS — J309 Allergic rhinitis, unspecified: Secondary | ICD-10-CM | POA: Diagnosis not present

## 2021-10-14 DIAGNOSIS — Z96641 Presence of right artificial hip joint: Secondary | ICD-10-CM | POA: Diagnosis not present

## 2021-10-14 DIAGNOSIS — M19049 Primary osteoarthritis, unspecified hand: Secondary | ICD-10-CM | POA: Diagnosis not present

## 2021-10-14 DIAGNOSIS — G479 Sleep disorder, unspecified: Secondary | ICD-10-CM | POA: Diagnosis not present

## 2021-10-14 DIAGNOSIS — Z86010 Personal history of colonic polyps: Secondary | ICD-10-CM | POA: Diagnosis not present

## 2021-10-14 DIAGNOSIS — Z9181 History of falling: Secondary | ICD-10-CM | POA: Diagnosis not present

## 2021-10-14 DIAGNOSIS — E039 Hypothyroidism, unspecified: Secondary | ICD-10-CM | POA: Diagnosis not present

## 2021-10-14 DIAGNOSIS — K219 Gastro-esophageal reflux disease without esophagitis: Secondary | ICD-10-CM | POA: Diagnosis not present

## 2021-10-14 DIAGNOSIS — J309 Allergic rhinitis, unspecified: Secondary | ICD-10-CM | POA: Diagnosis not present

## 2021-10-14 DIAGNOSIS — E785 Hyperlipidemia, unspecified: Secondary | ICD-10-CM | POA: Diagnosis not present

## 2021-10-14 DIAGNOSIS — M810 Age-related osteoporosis without current pathological fracture: Secondary | ICD-10-CM | POA: Diagnosis not present

## 2021-10-14 DIAGNOSIS — I10 Essential (primary) hypertension: Secondary | ICD-10-CM | POA: Diagnosis not present

## 2021-10-14 DIAGNOSIS — Z471 Aftercare following joint replacement surgery: Secondary | ICD-10-CM | POA: Diagnosis not present

## 2021-10-16 DIAGNOSIS — M1611 Unilateral primary osteoarthritis, right hip: Secondary | ICD-10-CM | POA: Diagnosis not present

## 2021-10-16 DIAGNOSIS — Z96641 Presence of right artificial hip joint: Secondary | ICD-10-CM | POA: Diagnosis not present

## 2021-10-21 DIAGNOSIS — I10 Essential (primary) hypertension: Secondary | ICD-10-CM | POA: Diagnosis not present

## 2021-10-21 DIAGNOSIS — Z86010 Personal history of colonic polyps: Secondary | ICD-10-CM | POA: Diagnosis not present

## 2021-10-21 DIAGNOSIS — Z9181 History of falling: Secondary | ICD-10-CM | POA: Diagnosis not present

## 2021-10-21 DIAGNOSIS — J309 Allergic rhinitis, unspecified: Secondary | ICD-10-CM | POA: Diagnosis not present

## 2021-10-21 DIAGNOSIS — E785 Hyperlipidemia, unspecified: Secondary | ICD-10-CM | POA: Diagnosis not present

## 2021-10-21 DIAGNOSIS — E039 Hypothyroidism, unspecified: Secondary | ICD-10-CM | POA: Diagnosis not present

## 2021-10-21 DIAGNOSIS — K219 Gastro-esophageal reflux disease without esophagitis: Secondary | ICD-10-CM | POA: Diagnosis not present

## 2021-10-21 DIAGNOSIS — M19049 Primary osteoarthritis, unspecified hand: Secondary | ICD-10-CM | POA: Diagnosis not present

## 2021-10-21 DIAGNOSIS — G479 Sleep disorder, unspecified: Secondary | ICD-10-CM | POA: Diagnosis not present

## 2021-10-21 DIAGNOSIS — M810 Age-related osteoporosis without current pathological fracture: Secondary | ICD-10-CM | POA: Diagnosis not present

## 2021-10-21 DIAGNOSIS — Z471 Aftercare following joint replacement surgery: Secondary | ICD-10-CM | POA: Diagnosis not present

## 2021-10-21 DIAGNOSIS — Z96641 Presence of right artificial hip joint: Secondary | ICD-10-CM | POA: Diagnosis not present

## 2021-10-28 DIAGNOSIS — Z96641 Presence of right artificial hip joint: Secondary | ICD-10-CM | POA: Diagnosis not present

## 2021-10-28 DIAGNOSIS — G479 Sleep disorder, unspecified: Secondary | ICD-10-CM | POA: Diagnosis not present

## 2021-10-28 DIAGNOSIS — M19049 Primary osteoarthritis, unspecified hand: Secondary | ICD-10-CM | POA: Diagnosis not present

## 2021-10-28 DIAGNOSIS — E039 Hypothyroidism, unspecified: Secondary | ICD-10-CM | POA: Diagnosis not present

## 2021-10-28 DIAGNOSIS — E785 Hyperlipidemia, unspecified: Secondary | ICD-10-CM | POA: Diagnosis not present

## 2021-10-28 DIAGNOSIS — I10 Essential (primary) hypertension: Secondary | ICD-10-CM | POA: Diagnosis not present

## 2021-10-28 DIAGNOSIS — Z9181 History of falling: Secondary | ICD-10-CM | POA: Diagnosis not present

## 2021-10-28 DIAGNOSIS — J309 Allergic rhinitis, unspecified: Secondary | ICD-10-CM | POA: Diagnosis not present

## 2021-10-28 DIAGNOSIS — K219 Gastro-esophageal reflux disease without esophagitis: Secondary | ICD-10-CM | POA: Diagnosis not present

## 2021-10-28 DIAGNOSIS — Z471 Aftercare following joint replacement surgery: Secondary | ICD-10-CM | POA: Diagnosis not present

## 2021-10-28 DIAGNOSIS — Z86010 Personal history of colonic polyps: Secondary | ICD-10-CM | POA: Diagnosis not present

## 2021-10-28 DIAGNOSIS — M810 Age-related osteoporosis without current pathological fracture: Secondary | ICD-10-CM | POA: Diagnosis not present

## 2021-11-04 DIAGNOSIS — E785 Hyperlipidemia, unspecified: Secondary | ICD-10-CM | POA: Diagnosis not present

## 2021-11-04 DIAGNOSIS — Z471 Aftercare following joint replacement surgery: Secondary | ICD-10-CM | POA: Diagnosis not present

## 2021-11-04 DIAGNOSIS — I10 Essential (primary) hypertension: Secondary | ICD-10-CM | POA: Diagnosis not present

## 2021-11-04 DIAGNOSIS — M19049 Primary osteoarthritis, unspecified hand: Secondary | ICD-10-CM | POA: Diagnosis not present

## 2021-11-04 DIAGNOSIS — Z96641 Presence of right artificial hip joint: Secondary | ICD-10-CM | POA: Diagnosis not present

## 2021-11-04 DIAGNOSIS — J309 Allergic rhinitis, unspecified: Secondary | ICD-10-CM | POA: Diagnosis not present

## 2021-11-04 DIAGNOSIS — K219 Gastro-esophageal reflux disease without esophagitis: Secondary | ICD-10-CM | POA: Diagnosis not present

## 2021-11-04 DIAGNOSIS — Z86010 Personal history of colonic polyps: Secondary | ICD-10-CM | POA: Diagnosis not present

## 2021-11-04 DIAGNOSIS — Z9181 History of falling: Secondary | ICD-10-CM | POA: Diagnosis not present

## 2021-11-04 DIAGNOSIS — E039 Hypothyroidism, unspecified: Secondary | ICD-10-CM | POA: Diagnosis not present

## 2021-11-04 DIAGNOSIS — G479 Sleep disorder, unspecified: Secondary | ICD-10-CM | POA: Diagnosis not present

## 2021-11-04 DIAGNOSIS — M810 Age-related osteoporosis without current pathological fracture: Secondary | ICD-10-CM | POA: Diagnosis not present

## 2021-12-25 DIAGNOSIS — D2272 Melanocytic nevi of left lower limb, including hip: Secondary | ICD-10-CM | POA: Diagnosis not present

## 2021-12-25 DIAGNOSIS — L720 Epidermal cyst: Secondary | ICD-10-CM | POA: Diagnosis not present

## 2021-12-25 DIAGNOSIS — L570 Actinic keratosis: Secondary | ICD-10-CM | POA: Diagnosis not present

## 2021-12-25 DIAGNOSIS — L821 Other seborrheic keratosis: Secondary | ICD-10-CM | POA: Diagnosis not present

## 2021-12-25 DIAGNOSIS — D225 Melanocytic nevi of trunk: Secondary | ICD-10-CM | POA: Diagnosis not present

## 2021-12-25 DIAGNOSIS — D2262 Melanocytic nevi of left upper limb, including shoulder: Secondary | ICD-10-CM | POA: Diagnosis not present

## 2021-12-25 DIAGNOSIS — D2261 Melanocytic nevi of right upper limb, including shoulder: Secondary | ICD-10-CM | POA: Diagnosis not present

## 2022-01-15 DIAGNOSIS — I10 Essential (primary) hypertension: Secondary | ICD-10-CM | POA: Diagnosis not present

## 2022-01-15 DIAGNOSIS — Z0000 Encounter for general adult medical examination without abnormal findings: Secondary | ICD-10-CM | POA: Diagnosis not present

## 2022-01-15 DIAGNOSIS — K219 Gastro-esophageal reflux disease without esophagitis: Secondary | ICD-10-CM | POA: Diagnosis not present

## 2022-01-15 DIAGNOSIS — F40298 Other specified phobia: Secondary | ICD-10-CM | POA: Diagnosis not present

## 2022-01-15 DIAGNOSIS — E039 Hypothyroidism, unspecified: Secondary | ICD-10-CM | POA: Diagnosis not present

## 2022-01-15 DIAGNOSIS — M25551 Pain in right hip: Secondary | ICD-10-CM | POA: Diagnosis not present

## 2022-01-15 DIAGNOSIS — Z6832 Body mass index (BMI) 32.0-32.9, adult: Secondary | ICD-10-CM | POA: Diagnosis not present

## 2022-01-15 DIAGNOSIS — G8929 Other chronic pain: Secondary | ICD-10-CM | POA: Diagnosis not present

## 2022-01-28 DIAGNOSIS — M159 Polyosteoarthritis, unspecified: Secondary | ICD-10-CM | POA: Diagnosis not present

## 2022-01-28 DIAGNOSIS — Z0000 Encounter for general adult medical examination without abnormal findings: Secondary | ICD-10-CM | POA: Diagnosis not present

## 2022-01-28 DIAGNOSIS — Z96641 Presence of right artificial hip joint: Secondary | ICD-10-CM | POA: Diagnosis not present

## 2022-01-28 DIAGNOSIS — M659 Synovitis and tenosynovitis, unspecified: Secondary | ICD-10-CM | POA: Diagnosis not present

## 2022-01-28 DIAGNOSIS — E039 Hypothyroidism, unspecified: Secondary | ICD-10-CM | POA: Diagnosis not present

## 2022-01-28 DIAGNOSIS — I10 Essential (primary) hypertension: Secondary | ICD-10-CM | POA: Diagnosis not present

## 2022-01-28 DIAGNOSIS — K219 Gastro-esophageal reflux disease without esophagitis: Secondary | ICD-10-CM | POA: Diagnosis not present

## 2022-01-28 DIAGNOSIS — R490 Dysphonia: Secondary | ICD-10-CM | POA: Diagnosis not present

## 2022-01-28 DIAGNOSIS — Z23 Encounter for immunization: Secondary | ICD-10-CM | POA: Diagnosis not present

## 2022-01-28 DIAGNOSIS — Z6832 Body mass index (BMI) 32.0-32.9, adult: Secondary | ICD-10-CM | POA: Diagnosis not present

## 2022-02-06 ENCOUNTER — Other Ambulatory Visit: Payer: Self-pay | Admitting: Internal Medicine

## 2022-02-06 DIAGNOSIS — Z1231 Encounter for screening mammogram for malignant neoplasm of breast: Secondary | ICD-10-CM

## 2022-02-10 ENCOUNTER — Ambulatory Visit
Admission: RE | Admit: 2022-02-10 | Discharge: 2022-02-10 | Disposition: A | Discharge status: Home / Self Care | Payer: PPO | Source: Ambulatory Visit | Attending: Internal Medicine | Admitting: Internal Medicine

## 2022-02-10 DIAGNOSIS — Z1231 Encounter for screening mammogram for malignant neoplasm of breast: Secondary | ICD-10-CM | POA: Insufficient documentation

## 2022-06-02 DIAGNOSIS — H35372 Puckering of macula, left eye: Secondary | ICD-10-CM | POA: Diagnosis not present

## 2022-06-02 DIAGNOSIS — Z961 Presence of intraocular lens: Secondary | ICD-10-CM | POA: Diagnosis not present

## 2022-07-23 DIAGNOSIS — E039 Hypothyroidism, unspecified: Secondary | ICD-10-CM | POA: Diagnosis not present

## 2022-07-23 DIAGNOSIS — Z0000 Encounter for general adult medical examination without abnormal findings: Secondary | ICD-10-CM | POA: Diagnosis not present

## 2022-07-23 DIAGNOSIS — K219 Gastro-esophageal reflux disease without esophagitis: Secondary | ICD-10-CM | POA: Diagnosis not present

## 2022-07-23 DIAGNOSIS — M659 Synovitis and tenosynovitis, unspecified: Secondary | ICD-10-CM | POA: Diagnosis not present

## 2022-07-23 DIAGNOSIS — Z6832 Body mass index (BMI) 32.0-32.9, adult: Secondary | ICD-10-CM | POA: Diagnosis not present

## 2022-07-23 DIAGNOSIS — I10 Essential (primary) hypertension: Secondary | ICD-10-CM | POA: Diagnosis not present

## 2022-07-23 DIAGNOSIS — R490 Dysphonia: Secondary | ICD-10-CM | POA: Diagnosis not present

## 2022-07-23 DIAGNOSIS — Z96641 Presence of right artificial hip joint: Secondary | ICD-10-CM | POA: Diagnosis not present

## 2022-07-23 DIAGNOSIS — M159 Polyosteoarthritis, unspecified: Secondary | ICD-10-CM | POA: Diagnosis not present

## 2022-07-30 DIAGNOSIS — G8929 Other chronic pain: Secondary | ICD-10-CM | POA: Diagnosis not present

## 2022-07-30 DIAGNOSIS — I10 Essential (primary) hypertension: Secondary | ICD-10-CM | POA: Diagnosis not present

## 2022-07-30 DIAGNOSIS — Z0000 Encounter for general adult medical examination without abnormal findings: Secondary | ICD-10-CM | POA: Diagnosis not present

## 2022-07-30 DIAGNOSIS — E039 Hypothyroidism, unspecified: Secondary | ICD-10-CM | POA: Diagnosis not present

## 2022-07-30 DIAGNOSIS — Z6833 Body mass index (BMI) 33.0-33.9, adult: Secondary | ICD-10-CM | POA: Diagnosis not present

## 2022-07-30 DIAGNOSIS — K5792 Diverticulitis of intestine, part unspecified, without perforation or abscess without bleeding: Secondary | ICD-10-CM | POA: Diagnosis not present

## 2022-07-30 DIAGNOSIS — M25551 Pain in right hip: Secondary | ICD-10-CM | POA: Diagnosis not present

## 2022-08-21 IMAGING — CT CT HEART MORP W/ CTA COR W/ SCORE W/ CA W/CM &/OR W/O CM
1 of 13 series · 4 of 20 positions shown, 5 images · non-contrast
Comparison: None.

Addendum:
CLINICAL DATA: Dyspnea on exertion

EXAM:
Cardiac/Coronary  CTA
TECHNIQUE: The patient was scanned on a Siemens Somatom go.Top scanner.

[Series 26: multiphase % cta coronary 0.60 · axial · 0.36mm/px · z∈[-1094,-1017]mm · 4 of 3852 slices shown, 5 images]
[im 771/3852  vessel]
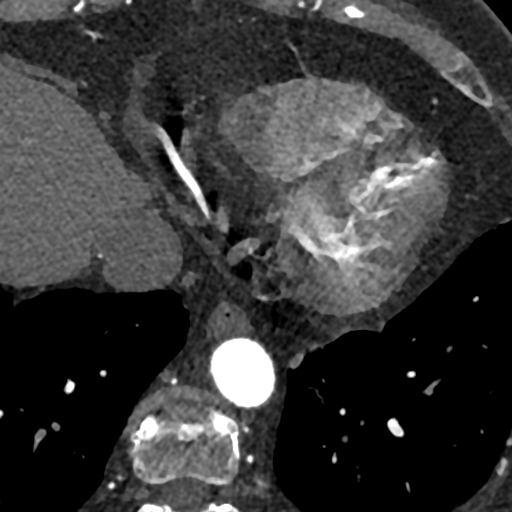
[im 771/3852  lung]
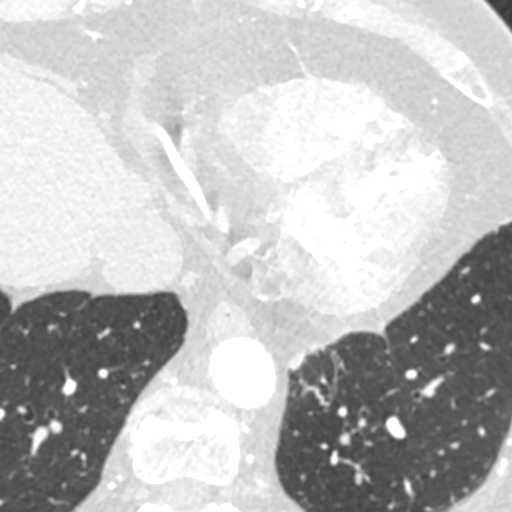
[im 1541/3852  vessel]
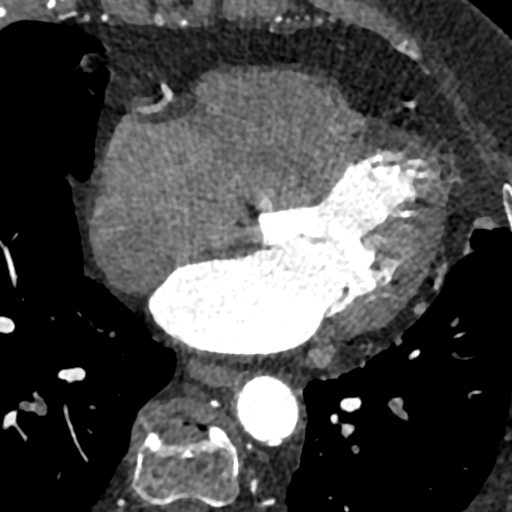
[im 2311/3852  vessel]
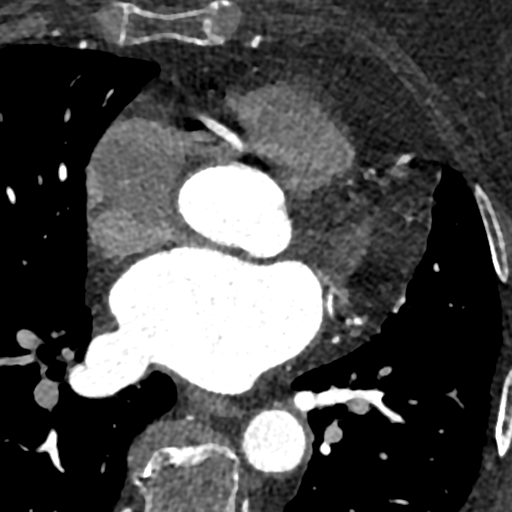
[im 3081/3852  vessel]
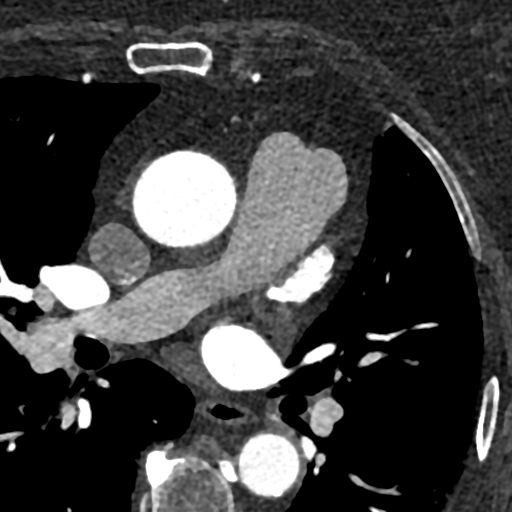

[4 of 20 positions shown; findings below may reference images not displayed]

:
A retrospective scan was triggered in the descending thoracic aorta.
Axial non-contrast 3 mm slices were carried out through the heart.
The data set was analyzed on a dedicated work station and scored
using the Agatson method. Gantry rotation speed was 330 msecs and
collimation was .6 mm. 100mg of metoprolol and 0.8 mg of sl NTG was
given. The 3D data set was reconstructed in 5% intervals of the
60-95 % of the R-R cycle. Diastolic phases were analyzed on a
dedicated work station using MPR, MIP and VRT modes. The patient
received 75 cc of contrast.
FINDINGS: Aorta: Normal size. Mild aortic root calcifications. No dissection.

Aortic Valve:  Trileaflet.  No calcifications.

Coronary Arteries:  Normal coronary origin.  Right dominance.

RCA is a dominant artery that gives rise to PDA and PLA. There is no
plaque.

Left main is a large artery that gives rise to LAD and LCX arteries.
Left main has no disease.

LAD has mild proximal calcifications causing minimal (<25%)
stenosis.

LCX is a non-dominant artery that gives rise to two obtuse marginal
branches. There is calcified plaque in the mid LCx causing moderate
stenosis.

Other findings:

Normal pulmonary vein drainage into the left atrium.

Normal left atrial appendage without a thrombus.

Normal size of the pulmonary artery.
IMPRESSION: 1. Coronary calcium score of 138. This was 66th percentile for age
and sex matched control.

2. Normal coronary origin with right dominance.

3. Calcified plaque causing moderate stenosis in the mid left
circumflex artery (50-69%).

4. CAD-RADS 3. Moderate stenosis. Consider symptom-guided
anti-ischemic pharmacotherapy as well as risk factor modification
per guideline directed care.

Additional analysis with CT FFR will be submitted and reported
separately.

EXAM:
OVER-READ INTERPRETATION  CT CHEST

The following report is an over-read performed by radiologist Dr.
does not include interpretation of cardiac or coronary anatomy or
pathology. The coronary CTA interpretation by the cardiologist is
attached.
FINDINGS: Cardiovascular: Top-normal heart size. No significant pericardial
effusion/thickening. Left anterior descending and left circumflex
coronary atherosclerosis. Atherosclerotic nonaneurysmal thoracic
aorta. Dilated main pulmonary artery (3.5 cm diameter). No central
pulmonary emboli.

Mediastinum/Nodes: Unremarkable esophagus. No pathologically
enlarged mediastinal or hilar lymph nodes.

Lungs/Pleura: No pneumothorax. No pleural effusion. Peripheral left
lower lobe 2 mm solid pulmonary nodule (series 20/image 46). No
acute consolidative airspace disease, lung masses or additional
significant pulmonary nodules.

Upper abdomen: Small hiatal hernia.

Musculoskeletal: No aggressive appearing focal osseous lesions. Mild
thoracic spondylosis.
IMPRESSION: 1. Dilated main pulmonary artery, suggesting pulmonary arterial
hypertension.
2. Solid 2 mm left lower lobe pulmonary nodule. No follow-up needed
if patient is low-risk. Non-contrast chest CT can be considered in
12 months if patient is high-risk. This recommendation follows the
consensus statement: Guidelines for Management of Incidental
Pulmonary Nodules Detected on CT Images: From the [HOSPITAL]
3. Small hiatal hernia.
4. Aortic Atherosclerosis ([id number]).

*** End of Addendum ***
:
A retrospective scan was triggered in the descending thoracic aorta.
Axial non-contrast 3 mm slices were carried out through the heart.
The data set was analyzed on a dedicated work station and scored
using the Agatson method. Gantry rotation speed was 330 msecs and
collimation was .6 mm. 100mg of metoprolol and 0.8 mg of sl NTG was
given. The 3D data set was reconstructed in 5% intervals of the
60-95 % of the R-R cycle. Diastolic phases were analyzed on a
dedicated work station using MPR, MIP and VRT modes. The patient
received 75 cc of contrast.
FINDINGS: Aorta: Normal size. Mild aortic root calcifications. No dissection.

Aortic Valve:  Trileaflet.  No calcifications.

Coronary Arteries:  Normal coronary origin.  Right dominance.

RCA is a dominant artery that gives rise to PDA and PLA. There is no
plaque.

Left main is a large artery that gives rise to LAD and LCX arteries.
Left main has no disease.

LAD has mild proximal calcifications causing minimal (<25%)
stenosis.

LCX is a non-dominant artery that gives rise to two obtuse marginal
branches. There is calcified plaque in the mid LCx causing moderate
stenosis.

Other findings:

Normal pulmonary vein drainage into the left atrium.

Normal left atrial appendage without a thrombus.

Normal size of the pulmonary artery.
IMPRESSION: 1. Coronary calcium score of 138. This was 66th percentile for age
and sex matched control.

2. Normal coronary origin with right dominance.

3. Calcified plaque causing moderate stenosis in the mid left
circumflex artery (50-69%).

4. CAD-RADS 3. Moderate stenosis. Consider symptom-guided
anti-ischemic pharmacotherapy as well as risk factor modification
per guideline directed care.

Additional analysis with CT FFR will be submitted and reported
separately.

## 2022-09-17 DIAGNOSIS — L570 Actinic keratosis: Secondary | ICD-10-CM | POA: Diagnosis not present

## 2023-01-23 DIAGNOSIS — M25551 Pain in right hip: Secondary | ICD-10-CM | POA: Diagnosis not present

## 2023-01-23 DIAGNOSIS — Z0000 Encounter for general adult medical examination without abnormal findings: Secondary | ICD-10-CM | POA: Diagnosis not present

## 2023-01-23 DIAGNOSIS — K5792 Diverticulitis of intestine, part unspecified, without perforation or abscess without bleeding: Secondary | ICD-10-CM | POA: Diagnosis not present

## 2023-01-23 DIAGNOSIS — G8929 Other chronic pain: Secondary | ICD-10-CM | POA: Diagnosis not present

## 2023-01-23 DIAGNOSIS — I10 Essential (primary) hypertension: Secondary | ICD-10-CM | POA: Diagnosis not present

## 2023-01-23 DIAGNOSIS — E039 Hypothyroidism, unspecified: Secondary | ICD-10-CM | POA: Diagnosis not present

## 2023-01-23 DIAGNOSIS — Z6833 Body mass index (BMI) 33.0-33.9, adult: Secondary | ICD-10-CM | POA: Diagnosis not present

## 2023-01-30 DIAGNOSIS — Z6833 Body mass index (BMI) 33.0-33.9, adult: Secondary | ICD-10-CM | POA: Diagnosis not present

## 2023-01-30 DIAGNOSIS — M25551 Pain in right hip: Secondary | ICD-10-CM | POA: Diagnosis not present

## 2023-01-30 DIAGNOSIS — K5792 Diverticulitis of intestine, part unspecified, without perforation or abscess without bleeding: Secondary | ICD-10-CM | POA: Diagnosis not present

## 2023-01-30 DIAGNOSIS — R2689 Other abnormalities of gait and mobility: Secondary | ICD-10-CM | POA: Diagnosis not present

## 2023-01-30 DIAGNOSIS — E039 Hypothyroidism, unspecified: Secondary | ICD-10-CM | POA: Diagnosis not present

## 2023-01-30 DIAGNOSIS — G8929 Other chronic pain: Secondary | ICD-10-CM | POA: Diagnosis not present

## 2023-01-30 DIAGNOSIS — I10 Essential (primary) hypertension: Secondary | ICD-10-CM | POA: Diagnosis not present

## 2023-01-30 DIAGNOSIS — Z23 Encounter for immunization: Secondary | ICD-10-CM | POA: Diagnosis not present

## 2023-01-30 DIAGNOSIS — Z96641 Presence of right artificial hip joint: Secondary | ICD-10-CM | POA: Diagnosis not present

## 2023-01-30 DIAGNOSIS — Z0000 Encounter for general adult medical examination without abnormal findings: Secondary | ICD-10-CM | POA: Diagnosis not present

## 2023-01-30 DIAGNOSIS — K219 Gastro-esophageal reflux disease without esophagitis: Secondary | ICD-10-CM | POA: Diagnosis not present

## 2023-03-02 ENCOUNTER — Other Ambulatory Visit: Payer: Self-pay | Admitting: Internal Medicine

## 2023-03-02 DIAGNOSIS — Z1231 Encounter for screening mammogram for malignant neoplasm of breast: Secondary | ICD-10-CM

## 2023-03-10 DIAGNOSIS — M7581 Other shoulder lesions, right shoulder: Secondary | ICD-10-CM | POA: Diagnosis not present

## 2023-03-18 ENCOUNTER — Ambulatory Visit
Admission: RE | Admit: 2023-03-18 | Discharge: 2023-03-18 | Disposition: A | Discharge status: Home / Self Care | Payer: PPO | Source: Ambulatory Visit | Attending: Internal Medicine | Admitting: Internal Medicine

## 2023-03-18 DIAGNOSIS — Z1231 Encounter for screening mammogram for malignant neoplasm of breast: Secondary | ICD-10-CM | POA: Insufficient documentation

## 2023-05-20 DIAGNOSIS — D2272 Melanocytic nevi of left lower limb, including hip: Secondary | ICD-10-CM | POA: Diagnosis not present

## 2023-05-20 DIAGNOSIS — D2262 Melanocytic nevi of left upper limb, including shoulder: Secondary | ICD-10-CM | POA: Diagnosis not present

## 2023-05-20 DIAGNOSIS — L820 Inflamed seborrheic keratosis: Secondary | ICD-10-CM | POA: Diagnosis not present

## 2023-05-20 DIAGNOSIS — L570 Actinic keratosis: Secondary | ICD-10-CM | POA: Diagnosis not present

## 2023-05-20 DIAGNOSIS — L538 Other specified erythematous conditions: Secondary | ICD-10-CM | POA: Diagnosis not present

## 2023-05-20 DIAGNOSIS — D225 Melanocytic nevi of trunk: Secondary | ICD-10-CM | POA: Diagnosis not present

## 2023-05-20 DIAGNOSIS — D2261 Melanocytic nevi of right upper limb, including shoulder: Secondary | ICD-10-CM | POA: Diagnosis not present

## 2023-05-20 DIAGNOSIS — L814 Other melanin hyperpigmentation: Secondary | ICD-10-CM | POA: Diagnosis not present

## 2023-05-20 DIAGNOSIS — L448 Other specified papulosquamous disorders: Secondary | ICD-10-CM | POA: Diagnosis not present

## 2023-07-09 DIAGNOSIS — H35372 Puckering of macula, left eye: Secondary | ICD-10-CM | POA: Diagnosis not present

## 2023-07-09 DIAGNOSIS — Z961 Presence of intraocular lens: Secondary | ICD-10-CM | POA: Diagnosis not present

## 2023-07-24 DIAGNOSIS — G8929 Other chronic pain: Secondary | ICD-10-CM | POA: Diagnosis not present

## 2023-07-24 DIAGNOSIS — R2689 Other abnormalities of gait and mobility: Secondary | ICD-10-CM | POA: Diagnosis not present

## 2023-07-24 DIAGNOSIS — Z96641 Presence of right artificial hip joint: Secondary | ICD-10-CM | POA: Diagnosis not present

## 2023-07-24 DIAGNOSIS — M25551 Pain in right hip: Secondary | ICD-10-CM | POA: Diagnosis not present

## 2023-07-24 DIAGNOSIS — I10 Essential (primary) hypertension: Secondary | ICD-10-CM | POA: Diagnosis not present

## 2023-07-24 DIAGNOSIS — E039 Hypothyroidism, unspecified: Secondary | ICD-10-CM | POA: Diagnosis not present

## 2023-07-24 DIAGNOSIS — K219 Gastro-esophageal reflux disease without esophagitis: Secondary | ICD-10-CM | POA: Diagnosis not present

## 2023-07-31 DIAGNOSIS — M25511 Pain in right shoulder: Secondary | ICD-10-CM | POA: Diagnosis not present

## 2023-07-31 DIAGNOSIS — R2681 Unsteadiness on feet: Secondary | ICD-10-CM | POA: Diagnosis not present

## 2023-07-31 DIAGNOSIS — Z0000 Encounter for general adult medical examination without abnormal findings: Secondary | ICD-10-CM | POA: Diagnosis not present

## 2023-07-31 DIAGNOSIS — E039 Hypothyroidism, unspecified: Secondary | ICD-10-CM | POA: Diagnosis not present

## 2023-07-31 DIAGNOSIS — Z96641 Presence of right artificial hip joint: Secondary | ICD-10-CM | POA: Diagnosis not present

## 2023-07-31 DIAGNOSIS — M25551 Pain in right hip: Secondary | ICD-10-CM | POA: Diagnosis not present

## 2023-07-31 DIAGNOSIS — G8929 Other chronic pain: Secondary | ICD-10-CM | POA: Diagnosis not present

## 2023-07-31 DIAGNOSIS — I10 Essential (primary) hypertension: Secondary | ICD-10-CM | POA: Diagnosis not present

## 2023-09-01 DIAGNOSIS — G8929 Other chronic pain: Secondary | ICD-10-CM | POA: Diagnosis not present

## 2023-09-01 DIAGNOSIS — M25511 Pain in right shoulder: Secondary | ICD-10-CM | POA: Diagnosis not present

## 2023-09-08 DIAGNOSIS — G8929 Other chronic pain: Secondary | ICD-10-CM | POA: Diagnosis not present

## 2023-09-08 DIAGNOSIS — M25511 Pain in right shoulder: Secondary | ICD-10-CM | POA: Diagnosis not present

## 2023-09-15 DIAGNOSIS — M6281 Muscle weakness (generalized): Secondary | ICD-10-CM | POA: Diagnosis not present

## 2023-09-15 DIAGNOSIS — M25511 Pain in right shoulder: Secondary | ICD-10-CM | POA: Diagnosis not present

## 2023-09-15 DIAGNOSIS — G8929 Other chronic pain: Secondary | ICD-10-CM | POA: Diagnosis not present

## 2023-09-15 DIAGNOSIS — M25551 Pain in right hip: Secondary | ICD-10-CM | POA: Diagnosis not present

## 2023-09-29 DIAGNOSIS — G8929 Other chronic pain: Secondary | ICD-10-CM | POA: Diagnosis not present

## 2023-09-29 DIAGNOSIS — M6281 Muscle weakness (generalized): Secondary | ICD-10-CM | POA: Diagnosis not present

## 2023-09-29 DIAGNOSIS — M25511 Pain in right shoulder: Secondary | ICD-10-CM | POA: Diagnosis not present

## 2023-09-29 DIAGNOSIS — M25551 Pain in right hip: Secondary | ICD-10-CM | POA: Diagnosis not present

## 2023-10-01 DIAGNOSIS — M25511 Pain in right shoulder: Secondary | ICD-10-CM | POA: Diagnosis not present

## 2023-10-01 DIAGNOSIS — M75121 Complete rotator cuff tear or rupture of right shoulder, not specified as traumatic: Secondary | ICD-10-CM | POA: Diagnosis not present

## 2023-10-01 DIAGNOSIS — M19011 Primary osteoarthritis, right shoulder: Secondary | ICD-10-CM | POA: Diagnosis not present

## 2023-10-15 DIAGNOSIS — M75121 Complete rotator cuff tear or rupture of right shoulder, not specified as traumatic: Secondary | ICD-10-CM | POA: Diagnosis not present

## 2023-10-15 DIAGNOSIS — M19011 Primary osteoarthritis, right shoulder: Secondary | ICD-10-CM | POA: Diagnosis not present

## 2023-10-15 DIAGNOSIS — M67911 Unspecified disorder of synovium and tendon, right shoulder: Secondary | ICD-10-CM | POA: Diagnosis not present

## 2023-10-30 DIAGNOSIS — M12811 Other specific arthropathies, not elsewhere classified, right shoulder: Secondary | ICD-10-CM | POA: Diagnosis not present

## 2023-10-30 DIAGNOSIS — M7581 Other shoulder lesions, right shoulder: Secondary | ICD-10-CM | POA: Diagnosis not present

## 2023-10-30 DIAGNOSIS — M75121 Complete rotator cuff tear or rupture of right shoulder, not specified as traumatic: Secondary | ICD-10-CM | POA: Diagnosis not present

## 2024-01-29 DIAGNOSIS — R2681 Unsteadiness on feet: Secondary | ICD-10-CM | POA: Diagnosis not present

## 2024-01-29 DIAGNOSIS — M25551 Pain in right hip: Secondary | ICD-10-CM | POA: Diagnosis not present

## 2024-01-29 DIAGNOSIS — E039 Hypothyroidism, unspecified: Secondary | ICD-10-CM | POA: Diagnosis not present

## 2024-01-29 DIAGNOSIS — G8929 Other chronic pain: Secondary | ICD-10-CM | POA: Diagnosis not present

## 2024-01-29 DIAGNOSIS — Z96641 Presence of right artificial hip joint: Secondary | ICD-10-CM | POA: Diagnosis not present

## 2024-01-29 DIAGNOSIS — M25511 Pain in right shoulder: Secondary | ICD-10-CM | POA: Diagnosis not present

## 2024-01-29 DIAGNOSIS — I10 Essential (primary) hypertension: Secondary | ICD-10-CM | POA: Diagnosis not present

## 2024-02-05 DIAGNOSIS — R5383 Other fatigue: Secondary | ICD-10-CM | POA: Diagnosis not present

## 2024-02-05 DIAGNOSIS — R5381 Other malaise: Secondary | ICD-10-CM | POA: Diagnosis not present

## 2024-02-05 DIAGNOSIS — I10 Essential (primary) hypertension: Secondary | ICD-10-CM | POA: Diagnosis not present

## 2024-02-05 DIAGNOSIS — Z1331 Encounter for screening for depression: Secondary | ICD-10-CM | POA: Diagnosis not present

## 2024-02-05 DIAGNOSIS — R400 Somnolence: Secondary | ICD-10-CM | POA: Diagnosis not present

## 2024-02-05 DIAGNOSIS — Z23 Encounter for immunization: Secondary | ICD-10-CM | POA: Diagnosis not present

## 2024-02-05 DIAGNOSIS — G4709 Other insomnia: Secondary | ICD-10-CM | POA: Diagnosis not present

## 2024-02-05 DIAGNOSIS — L659 Nonscarring hair loss, unspecified: Secondary | ICD-10-CM | POA: Diagnosis not present

## 2024-02-05 DIAGNOSIS — Z6834 Body mass index (BMI) 34.0-34.9, adult: Secondary | ICD-10-CM | POA: Diagnosis not present

## 2024-02-05 DIAGNOSIS — Z0000 Encounter for general adult medical examination without abnormal findings: Secondary | ICD-10-CM | POA: Diagnosis not present

## 2024-04-04 ENCOUNTER — Other Ambulatory Visit: Payer: Self-pay | Admitting: Internal Medicine

## 2024-04-04 DIAGNOSIS — Z1231 Encounter for screening mammogram for malignant neoplasm of breast: Secondary | ICD-10-CM

## 2024-04-11 DIAGNOSIS — Z860101 Personal history of adenomatous and serrated colon polyps: Secondary | ICD-10-CM | POA: Diagnosis not present

## 2024-04-11 DIAGNOSIS — K5909 Other constipation: Secondary | ICD-10-CM | POA: Diagnosis not present

## 2024-04-19 ENCOUNTER — Inpatient Hospital Stay: Admission: RE | Admit: 2024-04-19 | Discharge: 2024-04-19 | Attending: Internal Medicine | Admitting: Internal Medicine

## 2024-04-19 DIAGNOSIS — Z1231 Encounter for screening mammogram for malignant neoplasm of breast: Secondary | ICD-10-CM | POA: Insufficient documentation

## 2024-05-17 ENCOUNTER — Encounter: Payer: Self-pay | Admitting: Internal Medicine

## 2024-05-17 ENCOUNTER — Ambulatory Visit: Admitting: Anesthesiology

## 2024-05-17 ENCOUNTER — Encounter
Admission: RE | Disposition: A | Discharge status: Home / Self Care | Payer: Self-pay | Source: Home / Self Care | Attending: Internal Medicine

## 2024-05-17 ENCOUNTER — Ambulatory Visit
Admission: RE | Admit: 2024-05-17 | Discharge: 2024-05-17 | Disposition: A | Discharge status: Home / Self Care | Attending: Internal Medicine | Admitting: Internal Medicine

## 2024-05-17 DIAGNOSIS — K640 First degree hemorrhoids: Secondary | ICD-10-CM | POA: Diagnosis not present

## 2024-05-17 DIAGNOSIS — Z860101 Personal history of adenomatous and serrated colon polyps: Secondary | ICD-10-CM | POA: Insufficient documentation

## 2024-05-17 DIAGNOSIS — I10 Essential (primary) hypertension: Secondary | ICD-10-CM | POA: Insufficient documentation

## 2024-05-17 DIAGNOSIS — K5730 Diverticulosis of large intestine without perforation or abscess without bleeding: Secondary | ICD-10-CM | POA: Insufficient documentation

## 2024-05-17 DIAGNOSIS — Z1211 Encounter for screening for malignant neoplasm of colon: Secondary | ICD-10-CM | POA: Insufficient documentation

## 2024-05-17 DIAGNOSIS — E039 Hypothyroidism, unspecified: Secondary | ICD-10-CM | POA: Insufficient documentation

## 2024-05-17 DIAGNOSIS — Z87891 Personal history of nicotine dependence: Secondary | ICD-10-CM | POA: Diagnosis not present

## 2024-05-17 DIAGNOSIS — K219 Gastro-esophageal reflux disease without esophagitis: Secondary | ICD-10-CM | POA: Insufficient documentation

## 2024-05-17 DIAGNOSIS — K5900 Constipation, unspecified: Secondary | ICD-10-CM | POA: Diagnosis not present

## 2024-05-17 HISTORY — PX: COLONOSCOPY: SHX5424

## 2024-05-17 MED ORDER — LIDOCAINE HCL (CARDIAC) PF 100 MG/5ML IV SOSY
PREFILLED_SYRINGE | INTRAVENOUS | Status: DC | PRN
  Administered 2024-05-17: 60 mg via INTRAVENOUS

## 2024-05-17 MED ORDER — PROPOFOL 10 MG/ML IV BOLUS
INTRAVENOUS | Status: DC | PRN
  Administered 2024-05-17: 20 mg via INTRAVENOUS
  Administered 2024-05-17 (×2): 30 mg via INTRAVENOUS
  Administered 2024-05-17: 20 mg via INTRAVENOUS

## 2024-05-17 MED ORDER — PROPOFOL 500 MG/50ML IV EMUL
INTRAVENOUS | Status: DC | PRN
  Administered 2024-05-17: 50 ug/kg/min via INTRAVENOUS

## 2024-05-17 MED ORDER — SODIUM CHLORIDE 0.9 % IV SOLN
INTRAVENOUS | Status: DC
  Administered 2024-05-17: 500 mL via INTRAVENOUS

## 2024-05-17 NOTE — H&P (Signed)
 Outpatient short stay form Pre-procedure 05/17/2024 11:46 AM Brandi Schwartz K. Aundria, M.D.  Primary Physician: Brandi Schwartz, M.D.  Reason for visit:  Hx of adenomatous colon polyps  History of present illness:   Colonoscopy 7/20- Dr Aundria- small polyp, diverticulosis. 5y repeat Colonoscopy 2018- Dr Geryl- small adenoma, diverticulosis, hemorrhoids EGD- Dr Geryl- normal esophagis, gastritis, multiple gastric polyps, normal duodenum. There was no H pylori, dysplasia/malignancy.  Colonoscopy 2014- Dr Geryl- serrated adenoma, hyperplastic polyp   Brandi Schwartz was last seen on 10/28/18 by [name], NO to arrange surveillance colonoscopy due to history of adenomatous colon polyps. Since the last visit, states she is well. States she wants to do the colonoscopy. Has had some constipation over the last some months which is new for her. States she does not drink as much water as she probably should and does not eat enough fruit/veg. Denies abdominal pain, dyspepsia, NVD, problems swallowing, melena/hematochezia, unplanned loss of weight, and all other GI related complaints.  Cbc/cmp unremarkable No problems with sedated procedures   Current Medications[1]  Medications Prior to Admission  Medication Sig Dispense Refill Last Dose/Taking   amLODipine  (NORVASC ) 5 MG tablet Take 5 mg by mouth daily.   05/17/2024 Morning   docusate sodium  (COLACE) 100 MG capsule Take 1 capsule (100 mg total) by mouth 2 (two) times daily. 10 capsule 0 Past Week   fluticasone  (FLONASE ) 50 MCG/ACT nasal spray Place 1 spray into both nostrils daily as needed for allergies or rhinitis.   05/16/2024 Evening   hydrochlorothiazide  (HYDRODIURIL ) 12.5 MG tablet Take 12.5 mg by mouth daily.   05/16/2024 Morning   levothyroxine  (SYNTHROID ) 125 MCG tablet Take 125 mcg by mouth daily before breakfast.   05/17/2024 Morning   losartan  (COZAAR ) 100 MG tablet Take 100 mg by mouth daily.   05/17/2024 Morning   omeprazole (PRILOSEC) 20 MG capsule  Take 20 mg by mouth 2 (two) times daily before a meal.   05/16/2024 Morning   enoxaparin  (LOVENOX ) 40 MG/0.4ML injection Inject 0.4 mLs (40 mg total) into the skin daily for 14 days. 5.6 mL 0    HYDROcodone -acetaminophen  (NORCO/VICODIN) 5-325 MG tablet Take 1-2 tablets by mouth every 4 (four) hours as needed for moderate pain (pain score 4-6). (Patient not taking: Reported on 05/17/2024) 30 tablet 0 Completed Course   traMADol  (ULTRAM ) 50 MG tablet Take 1 tablet (50 mg total) by mouth every 6 (six) hours. (Patient not taking: Reported on 05/17/2024) 30 tablet 0 Completed Course   Vitamin D, Ergocalciferol, (DRISDOL) 50000 units CAPS capsule Take 50,000 Units by mouth every 7 (seven) days. (Patient not taking: Reported on 05/17/2024)   Not Taking     Allergies[2]   Past Medical History:  Diagnosis Date   Anginal pain    neg stress test, neg ECHO tes   Arthritis    GERD (gastroesophageal reflux disease)    History of colonic polyps    History of shingles    Hoarseness, chronic    Hypertension    Hypothyroidism    Osteoporosis    PONV (postoperative nausea and vomiting)    many yrs ago.  None recently   Wears dentures    full upper    Review of systems:  Otherwise negative.    Physical Exam  Gen: Alert, oriented. Appears stated age.  HEENT: [place]/AT. PERRLA. Lungs: CTA, no wheezes. CV: RR nl S1, S2. Abd: soft, benign, no masses. BS+ Ext: No edema. Pulses 2+    Planned procedures: Proceed with colonoscopy. The patient  understands the nature of the planned procedure, indications, risks, alternatives and potential complications including but not limited to bleeding, infection, perforation, damage to internal organs and possible oversedation/side effects from anesthesia. The patient agrees and gives consent to proceed.  Please refer to procedure notes for findings, recommendations and patient disposition/instructions.     Brandi Schwartz K. Aundria, M.D. Gastroenterology 05/17/2024  11:46  AM          [1]  Current Facility-Administered Medications:    0.9 %  sodium chloride  infusion, , Intravenous, Continuous, [place], Braycen Burandt K, MD, Last Rate: 20 mL/hr at 05/17/24 1136, Continued from Pre-op at 05/17/24 1136 [2]  Allergies Allergen Reactions   Codeine Other (See Comments)    ABD.PAIN

## 2024-05-17 NOTE — Anesthesia Postprocedure Evaluation (Signed)
"   Anesthesia Post Note  Patient: Brandi Schwartz  Procedure(s) Performed: COLONOSCOPY  Patient location during evaluation: Endoscopy Anesthesia Type: General Level of consciousness: awake and alert Pain management: pain level controlled Vital Signs Assessment: post-procedure vital signs reviewed and stable Respiratory status: spontaneous breathing, nonlabored ventilation, respiratory function stable and patient connected to nasal cannula oxygen Cardiovascular status: blood pressure returned to baseline and stable Postop Assessment: no apparent nausea or vomiting Anesthetic complications: no   No notable events documented.   Last Vitals:  Vitals:   05/17/24 1107 05/17/24 1217  BP: (!) 158/76 120/64  Pulse: 86 67  Resp: 16 19  Temp: (!) 36.1 C (!) 36.2 C  SpO2: 97% 97%    Last Pain:  Vitals:   05/17/24 1217  TempSrc: Temporal  PainSc: 0-No pain                 Brandi Schwartz      "

## 2024-05-17 NOTE — Op Note (Signed)
 [facility] Patient Name: Brandi Schwartz Procedure Date: 05/17/2024 11:50 AM MRN: [id number] Account #: [id number] Date of Birth: 04-18-1946 Admit Type: Outpatient Age: 79 Room: [facility] ENDO ROOM 1 Gender: Female Note Status: Finalized Instrument Name: Colon Scope [id number] Procedure:             Colonoscopy Indications:           High risk colon cancer surveillance: Personal history                         of multiple (3 or more) adenomas Providers:             Chau Savell K. Aundria MD, MD Referring MD:          Tamra Leventhal, MD (Referring MD) Medicines:             Propofol  per Anesthesia Complications:         No immediate complications. Estimated blood loss: None. Procedure:             Pre-Anesthesia Assessment:                        - The risks and benefits of the procedure and the                         sedation options and risks were discussed with the                         patient. All questions were answered and informed                         consent was obtained.                        - Patient identification and proposed procedure were                         verified prior to the procedure by the nurse. The                         procedure was verified in the procedure room.                        - ASA Grade Assessment: III - A patient with severe                         systemic disease.                        - After reviewing the risks and benefits, the patient                         was deemed in satisfactory condition to undergo the                         procedure.                        After obtaining informed consent, the colonoscope was  passed under direct vision. Throughout the procedure,                         the patient's blood pressure, pulse, and oxygen                         saturations were monitored continuously. The                         Colonoscope was introduced through the anus  and                         advanced to the the cecum, identified by appendiceal                         orifice and ileocecal valve. The colonoscopy was                         performed without difficulty. The patient tolerated                         the procedure well. The quality of the bowel                         preparation was good. The ileocecal valve, appendiceal                         orifice, and rectum were photographed. Findings:      The perianal and digital rectal examinations were normal. Pertinent       negatives include normal sphincter tone and no palpable rectal lesions.      Non-bleeding internal hemorrhoids were found during retroflexion. The       hemorrhoids were Grade I (internal hemorrhoids that do not prolapse).      Many small-mouthed diverticula were found in the sigmoid colon. There       was no evidence of diverticular bleeding.      A tattoo was seen in the ascending colon. The tattoo site appeared       normal. Estimated blood loss: none.      The exam was otherwise without abnormality. Impression:            - Non-bleeding internal hemorrhoids.                        - Diverticulosis in the sigmoid colon. There was no                         evidence of diverticular bleeding.                        - A tattoo was seen in the ascending colon. The tattoo                         site appeared normal.                        - The examination was otherwise normal.                        -  No specimens collected. Recommendation:        - Patient has a contact number available for                         emergencies. The signs and symptoms of potential                         delayed complications were discussed with the patient.                         Return to normal activities tomorrow. Written                         discharge instructions were provided to the patient.                        - High fiber diet.                        - Continue present  medications.                        - You do NOT require further colon cancer screening                         measures (Annual stool testing (i.e. hemoccult, FIT,                         cologuard), sigmoidoscopy, colonoscopy or CT                         colonography). You should share this recommendation                         with your Primary Care provider.                        - Return to GI office PRN.                        - The findings and recommendations were discussed with                         the patient. Procedure Code(s):     --- Professional ---                        H9894, Colorectal cancer screening; colonoscopy on                         individual at high risk Diagnosis Code(s):     --- Professional ---                        K57.30, Diverticulosis of large intestine without                         perforation or abscess without bleeding                        K64.0, First degree hemorrhoids  Z86.010, Personal history of colonic polyps CPT copyright 2022 [employer]. All rights reserved. The codes documented in this report are preliminary and upon coder review may  be revised to meet current compliance requirements. Ladell MARLA Boss MD, MD 05/17/2024 12:21:51 PM This report has been signed electronically. Number of Addenda: 0 Note Initiated On: 05/17/2024 11:50 AM Scope Withdrawal Time: 0 hours 7 minutes 8 seconds  Total Procedure Duration: 0 hours 13 minutes 5 seconds  Estimated Blood Loss:  Estimated blood loss: none.      [facility]

## 2024-05-17 NOTE — Anesthesia Preprocedure Evaluation (Signed)
 "                                  Anesthesia Evaluation  Patient identified by MRN, date of birth, ID band Patient awake    Reviewed: Allergy & Precautions, NPO status , Patient's Chart, lab work & pertinent test results  History of Anesthesia Complications (+) PONV and history of anesthetic complications  Airway Mallampati: III  TM Distance: >3 FB Neck ROM: full    Dental  (+) Upper Dentures, Lower Dentures   Pulmonary neg pulmonary ROS, former smoker   Pulmonary exam normal        Cardiovascular hypertension, negative cardio ROS Normal cardiovascular exam     Neuro/Psych negative neurological ROS  negative psych ROS   GI/Hepatic Neg liver ROS,GERD  ,,  Endo/Other  Hypothyroidism    Renal/GU negative Renal ROS  negative genitourinary   Musculoskeletal   Abdominal   Peds  Hematology negative hematology ROS (+)   Anesthesia Other Findings Past Medical History: No date: Anginal pain     Comment:  neg stress test, neg ECHO tes No date: Arthritis No date: GERD (gastroesophageal reflux disease) No date: History of colonic polyps No date: History of shingles No date: Hoarseness, chronic No date: Hypertension No date: Hypothyroidism No date: Osteoporosis No date: PONV (postoperative nausea and vomiting)     Comment:  many yrs ago.  None recently No date: Wears dentures     Comment:  full upper  Past Surgical History: No date: ABDOMINAL HYSTERECTOMY 2005: BREAST EXCISIONAL BIOPSY; Left     Comment:  NEG 05/27/2017: CATARACT EXTRACTION W/PHACO; Left     Comment:  Procedure: CATARACT EXTRACTION PHACO AND INTRAOCULAR               LENS PLACEMENT (IOC) LEFT;  Surgeon: Mittie Gaskin, MD;  Location: [facility];  Service:               Ophthalmology;  Laterality: Left; 06/24/2017: CATARACT EXTRACTION W/PHACO; Right     Comment:  Procedure: CATARACT EXTRACTION PHACO AND INTRAOCULAR               LENS PLACEMENT (IOC)  RIGHT;  Surgeon: Mittie Gaskin, MD;  Location: [facility];  Service:               Ophthalmology;  Laterality: Right;  cannot arrive before               830AM No date: COLONOSCOPY 07/09/2016: COLONOSCOPY WITH PROPOFOL ; N/A     Comment:  Procedure: COLONOSCOPY WITH PROPOFOL ;  Surgeon: Lamar ONEIDA Holmes, MD;  Location: [facility] ENDOSCOPY;  Service:               Endoscopy;  Laterality: N/A; 11/29/2018: COLONOSCOPY WITH PROPOFOL ; N/A     Comment:  Procedure: COLONOSCOPY WITH PROPOFOL ;  Surgeon: [place],               Ladell POUR, MD;  Location: [facility] ENDOSCOPY;  Service:               Gastroenterology;  Laterality: N/A; No date: ESOPHAGOGASTRODUODENOSCOPY 07/09/2016: ESOPHAGOGASTRODUODENOSCOPY; N/A     Comment:  Procedure: ESOPHAGOGASTRODUODENOSCOPY (EGD);  Surgeon:  Lamar ONEIDA Holmes, MD;  Location: [facility] ENDOSCOPY;                Service: Endoscopy;  Laterality: N/A; 09/03/2021: TOTAL HIP ARTHROPLASTY; Right     Comment:  Procedure: TOTAL HIP ARTHROPLASTY ANTERIOR APPROACH;                Surgeon: Kathlynn Sharper, MD;  Location: [facility] ORS;                Service: Orthopedics;  Laterality: Right;     Reproductive/Obstetrics negative OB ROS                              Anesthesia Physical Anesthesia Plan  ASA: 3  Anesthesia Plan: General   Post-op Pain Management: Minimal or no pain anticipated   Induction: Intravenous  PONV Risk Score and Plan: 3 and Propofol  infusion and TIVA  Airway Management Planned: Natural Airway and Nasal Cannula  Additional Equipment:   Intra-op Plan:   Post-operative Plan:   Informed Consent: I have reviewed the patients History and Physical, chart, labs and discussed the procedure including the risks, benefits and alternatives for the proposed anesthesia with the patient or authorized representative who has indicated his/her understanding and acceptance.     Dental Advisory  Given  Plan Discussed with: Anesthesiologist, CRNA and Surgeon  Anesthesia Plan Comments: (Patient consented for risks of anesthesia including but not limited to:  - adverse reactions to medications - risk of airway placement if required - damage to eyes, teeth, lips or other oral mucosa - nerve damage due to positioning  - sore throat or hoarseness - Damage to heart, brain, nerves, lungs, other parts of body or loss of life  Patient voiced understanding and assent.)        Anesthesia Quick Evaluation  "

## 2024-05-17 NOTE — Transfer of Care (Signed)
 Immediate Anesthesia Transfer of Care Note  Patient: Brandi Schwartz  Procedure(s) Performed: COLONOSCOPY  Patient Location: PACU  Anesthesia Type:General  Level of Consciousness: sedated  Airway & Oxygen Therapy: Patient Spontanous Breathing  Post-op Assessment: Report given to RN and Post -op Vital signs reviewed and stable  Post vital signs: Reviewed and stable  Last Vitals:  Vitals Value Taken Time  BP    Temp    Pulse    Resp    SpO2      Last Pain:  Vitals:   05/17/24 1107  TempSrc: Temporal  PainSc: 0-No pain         Complications: No notable events documented.

## 2024-05-17 NOTE — Interval H&P Note (Signed)
 History and Physical Interval Note:  05/17/2024 11:48 AM  Brandi Schwartz  has presented today for surgery, with the diagnosis of Hx of adenomatous polyp oif colon,.  The various methods of treatment have been discussed with the patient and family. After consideration of risks, benefits and other options for treatment, the patient has consented to  Procedures: COLONOSCOPY (N/A) as a surgical intervention.  The patient's history has been reviewed, patient examined, no change in status, stable for surgery.  I have reviewed the patient's chart and labs.  Questions were answered to the patient's satisfaction.     [place], [name]
